# Patient Record
Sex: Female | Born: 1950 | Race: White | Hispanic: No | Marital: Married
Health system: Southern US, Community
[De-identification: ages and names within clinical notes are randomized; demographics above are authoritative.]

## PROBLEM LIST (undated history)

## (undated) HISTORY — PX: ABDOMINAL HYSTERECTOMY: SHX81

---

## 2008-10-21 ENCOUNTER — Ambulatory Visit: Payer: Self-pay | Admitting: Internal Medicine

## 2009-03-08 ENCOUNTER — Ambulatory Visit: Payer: Self-pay | Admitting: Obstetrics & Gynecology

## 2009-03-11 ENCOUNTER — Ambulatory Visit: Payer: Self-pay | Admitting: Obstetrics & Gynecology

## 2010-05-06 ENCOUNTER — Ambulatory Visit: Payer: Self-pay | Admitting: Oncology

## 2010-05-10 LAB — CBC WITH DIFFERENTIAL/PLATELET
BASO%: 0.6 % (ref 0.0–2.0)
Basophils Absolute: 0 10*3/uL (ref 0.0–0.1)
EOS%: 15.4 % — ABNORMAL HIGH (ref 0.0–7.0)
Eosinophils Absolute: 1.2 10*3/uL — ABNORMAL HIGH (ref 0.0–0.5)
HCT: 35 % (ref 34.8–46.6)
LYMPH%: 18.6 % (ref 14.0–49.7)
MCHC: 33.7 g/dL (ref 31.5–36.0)
RDW: 13.7 % (ref 11.2–14.5)
lymph#: 1.5 10*3/uL (ref 0.9–3.3)

## 2010-05-11 ENCOUNTER — Ambulatory Visit (HOSPITAL_COMMUNITY): Admission: RE | Admit: 2010-05-11 | Discharge: 2010-05-11 | Payer: Self-pay | Admitting: Oncology

## 2010-05-12 LAB — COMPREHENSIVE METABOLIC PANEL
AST: 17 U/L (ref 0–37)
BUN: 31 mg/dL — ABNORMAL HIGH (ref 6–23)
Chloride: 99 mEq/L (ref 96–112)
Creatinine, Ser: 1.26 mg/dL — ABNORMAL HIGH (ref 0.40–1.20)
Glucose, Bld: 160 mg/dL — ABNORMAL HIGH (ref 70–99)
Potassium: 3.6 mEq/L (ref 3.5–5.3)

## 2010-05-12 LAB — SPEP & IFE WITH QIG
Alpha-2-Globulin: 9.7 % (ref 7.1–11.8)
Beta Globulin: 7.8 % — ABNORMAL HIGH (ref 4.7–7.2)
Gamma Globulin: 16.5 % (ref 11.1–18.8)
IgA: 356 mg/dL (ref 68–378)
IgG (Immunoglobin G), Serum: 1270 mg/dL (ref 694–1618)

## 2010-05-12 LAB — KAPPA/LAMBDA LIGHT CHAINS
Kappa:Lambda Ratio: 1.11 (ref 0.26–1.65)
Lambda Free Lght Chn: 2.8 mg/dL — ABNORMAL HIGH (ref 0.57–2.63)

## 2010-05-12 LAB — LACTATE DEHYDROGENASE: LDH: 144 U/L (ref 94–250)

## 2010-05-20 ENCOUNTER — Ambulatory Visit (HOSPITAL_COMMUNITY): Admission: RE | Admit: 2010-05-20 | Discharge: 2010-05-20 | Payer: Self-pay | Admitting: Oncology

## 2010-06-09 ENCOUNTER — Ambulatory Visit: Payer: Self-pay | Admitting: Otolaryngology

## 2010-06-20 ENCOUNTER — Ambulatory Visit: Payer: Self-pay | Admitting: Otolaryngology

## 2010-06-23 ENCOUNTER — Encounter: Admission: RE | Admit: 2010-06-23 | Discharge: 2010-06-23 | Payer: Self-pay | Admitting: Orthopedic Surgery

## 2010-07-06 ENCOUNTER — Encounter: Admission: RE | Admit: 2010-07-06 | Discharge: 2010-07-06 | Payer: Self-pay | Admitting: Orthopedic Surgery

## 2010-08-17 ENCOUNTER — Encounter: Admission: RE | Admit: 2010-08-17 | Discharge: 2010-08-17 | Payer: Self-pay | Admitting: Orthopedic Surgery

## 2010-08-23 ENCOUNTER — Ambulatory Visit: Payer: Self-pay | Admitting: Oncology

## 2010-08-24 LAB — CBC WITH DIFFERENTIAL/PLATELET
MCH: 31.7 pg (ref 25.1–34.0)
MCHC: 34 g/dL (ref 31.5–36.0)
MONO#: 1 10*3/uL — ABNORMAL HIGH (ref 0.1–0.9)
NEUT#: 4.9 10*3/uL (ref 1.5–6.5)
Platelets: 218 10*3/uL (ref 145–400)
RBC: 3.56 10*6/uL — ABNORMAL LOW (ref 3.70–5.45)

## 2010-08-26 LAB — SPEP & IFE WITH QIG
Albumin ELP: 59.6 % (ref 55.8–66.1)
Alpha-1-Globulin: 4 % (ref 2.9–4.9)
IgA: 255 mg/dL (ref 68–378)
IgG (Immunoglobin G), Serum: 979 mg/dL (ref 694–1618)
IgM, Serum: 172 mg/dL (ref 60–263)

## 2010-08-26 LAB — COMPREHENSIVE METABOLIC PANEL
ALT: 27 U/L (ref 0–35)
AST: 18 U/L (ref 0–37)
Alkaline Phosphatase: 59 U/L (ref 39–117)
CO2: 25 mEq/L (ref 19–32)
Calcium: 9.6 mg/dL (ref 8.4–10.5)
Chloride: 103 mEq/L (ref 96–112)
Creatinine, Ser: 1 mg/dL (ref 0.40–1.20)
Glucose, Bld: 104 mg/dL — ABNORMAL HIGH (ref 70–99)
Potassium: 4.1 mEq/L (ref 3.5–5.3)
Sodium: 141 mEq/L (ref 135–145)
Total Bilirubin: 0.4 mg/dL (ref 0.3–1.2)

## 2010-08-26 LAB — KAPPA/LAMBDA LIGHT CHAINS
Kappa free light chain: 0.67 mg/dL (ref 0.33–1.94)
Kappa:Lambda Ratio: 0.76 (ref 0.26–1.65)
Lambda Free Lght Chn: 0.88 mg/dL (ref 0.57–2.63)

## 2011-02-05 LAB — GLUCOSE, CAPILLARY: Glucose-Capillary: 147 mg/dL — ABNORMAL HIGH (ref 70–99)

## 2011-03-12 ENCOUNTER — Emergency Department: Payer: Self-pay | Admitting: Emergency Medicine

## 2011-05-29 DIAGNOSIS — E119 Type 2 diabetes mellitus without complications: Secondary | ICD-10-CM | POA: Insufficient documentation

## 2011-05-29 DIAGNOSIS — J45909 Unspecified asthma, uncomplicated: Secondary | ICD-10-CM | POA: Insufficient documentation

## 2011-05-29 DIAGNOSIS — E782 Mixed hyperlipidemia: Secondary | ICD-10-CM | POA: Insufficient documentation

## 2011-08-16 DIAGNOSIS — K5909 Other constipation: Secondary | ICD-10-CM | POA: Insufficient documentation

## 2011-08-16 DIAGNOSIS — K219 Gastro-esophageal reflux disease without esophagitis: Secondary | ICD-10-CM | POA: Insufficient documentation

## 2012-03-12 ENCOUNTER — Ambulatory Visit: Payer: Self-pay | Admitting: Otolaryngology

## 2012-04-02 ENCOUNTER — Ambulatory Visit: Payer: Self-pay | Admitting: Family Medicine

## 2012-04-04 ENCOUNTER — Encounter: Payer: Self-pay | Admitting: Otolaryngology

## 2012-04-20 ENCOUNTER — Encounter: Payer: Self-pay | Admitting: Otolaryngology

## 2012-05-20 ENCOUNTER — Encounter: Payer: Self-pay | Admitting: Otolaryngology

## 2013-06-13 DIAGNOSIS — D649 Anemia, unspecified: Secondary | ICD-10-CM | POA: Insufficient documentation

## 2013-06-26 ENCOUNTER — Ambulatory Visit: Payer: Self-pay | Admitting: Family Medicine

## 2013-12-23 DIAGNOSIS — R102 Pelvic and perineal pain: Secondary | ICD-10-CM | POA: Insufficient documentation

## 2014-06-29 DIAGNOSIS — K769 Liver disease, unspecified: Secondary | ICD-10-CM | POA: Insufficient documentation

## 2014-08-13 ENCOUNTER — Ambulatory Visit: Payer: Self-pay | Admitting: Geriatric Medicine

## 2014-10-09 ENCOUNTER — Ambulatory Visit: Payer: Self-pay | Admitting: Rehabilitation

## 2015-05-03 DIAGNOSIS — N3000 Acute cystitis without hematuria: Secondary | ICD-10-CM | POA: Insufficient documentation

## 2015-10-01 DIAGNOSIS — M79673 Pain in unspecified foot: Secondary | ICD-10-CM | POA: Insufficient documentation

## 2015-11-10 DIAGNOSIS — G8929 Other chronic pain: Secondary | ICD-10-CM | POA: Insufficient documentation

## 2015-11-10 DIAGNOSIS — M545 Low back pain: Secondary | ICD-10-CM

## 2015-11-10 DIAGNOSIS — G894 Chronic pain syndrome: Secondary | ICD-10-CM | POA: Insufficient documentation

## 2016-07-05 ENCOUNTER — Encounter: Attending: Internal Medicine | Admitting: *Deleted

## 2016-07-05 VITALS — BP 122/60 | HR 61 | Ht 63.5 in | Wt 180.2 lb

## 2016-07-05 DIAGNOSIS — Z955 Presence of coronary angioplasty implant and graft: Secondary | ICD-10-CM

## 2016-07-05 DIAGNOSIS — K760 Fatty (change of) liver, not elsewhere classified: Secondary | ICD-10-CM | POA: Insufficient documentation

## 2016-07-05 NOTE — Progress Notes (Signed)
Cardiac Individual Treatment Plan  Patient Details  Name: Suzanne Bernard MRN: [id number] Date of Birth: 11-08-1951 Referring Provider:   Flowsheet Row [facility]  Referring Provider  Cavender      Initial Encounter Date:  Flowsheet Row [facility]iac [facility]05[facility]  Date  07/05/16  Referring Provider  Cavender      Visit Diagnosis: S/P coronary artery stent placement  Patient's Home Medications on Admission:  Current Outpatient Prescriptions:  .  allopurinol (ZYLOPRIM) 300 MG tablet, Take by mouth., Disp: , Rfl:  .  amLODipine (NORVASC) 10 MG tablet, Take by mouth., Disp: , Rfl:  .  carvedilol (COREG) 12.5 MG tablet, Take by mouth., Disp: , Rfl:  .  colestipol (COLESTID) 1 g tablet, TAKE 4 TABLETS DAILY, Disp: , Rfl:  .  gemfibrozil (LOPID) 600 MG tablet, Take by mouth., Disp: , Rfl:  .  glimepiride (AMARYL) 4 MG tablet, Take by mouth 2 (two) times daily., Disp: , Rfl:  .  glucose blood (FREESTYLE LITE) test strip, , Disp: , Rfl:  .  losartan-hydrochlorothiazide (HYZAAR) 100-25 MG tablet, Take by mouth., Disp: , Rfl:  .  lubiprostone (AMITIZA) 24 MCG capsule, TAKE 1 CAPSULE TWICE A DAY WITH MEALS, Disp: , Rfl:  .  metFORMIN (GLUCOPHAGE) 1000 MG tablet, TAKE 1 TABLET TWICE A DAY, Disp: , Rfl:  .  montelukast (SINGULAIR) 10 MG tablet, Take by mouth., Disp: , Rfl:  .  ranitidine (ZANTAC) 150 MG capsule, Take by mouth., Disp: , Rfl:  .  sitaGLIPtin (JANUVIA) 100 MG tablet, once daily., Disp: , Rfl:  .  gabapentin (NEURONTIN) 300 MG capsule, Take by mouth., Disp: , Rfl:  .  Insulin Glargine (LANTUS SOLOSTAR) 100 UNIT/ML Solostar Pen, Inject into the skin., Disp: , Rfl:   Past Medical History: No past medical history on file.  Tobacco Use: History  Smoking Status  . Not on file  Smokeless Tobacco  . Not on file    Labs: Recent Review Flowsheet Data    There is no flowsheet data to  display.       Exercise Target Goals: Date: 07/05/16  Exercise Program Goal: Individual exercise prescription set with THRR, safety & activity barriers. Participant demonstrates ability to understand and report RPE using BORG scale, to self-measure pulse accurately, and to acknowledge the importance of the exercise prescription.  Exercise Prescription Goal: Starting with aerobic activity 30 plus minutes a day, 3 days per week for initial exercise prescription. Provide home exercise prescription and guidelines that participant acknowledges understanding prior to discharge.  Activity Barriers & Risk Stratification:     Activity Barriers & Cardiac Risk Stratification - 07/05/16 1539      Activity Barriers & Cardiac Risk Stratification   Activity Barriers Back Problems;Deconditioning;Neck/Spine Problems;Shortness of Breath;Joint Problems      6 Minute Walk:     6 Minute Walk    Row Name 07/05/16 1516         6 Minute Walk   Distance 1333 feet     Walk Time 6 minutes     # of Rest Breaks 0     MPH 2.5     METS 2.94     RPE 8     VO2 Peak 10.3     Symptoms No     Resting HR 61 bpm     Resting BP 122/60     Max Ex. HR 90 bpm  Max Ex. BP 128/54        Initial Exercise Prescription:     Initial Exercise Prescription - 07/05/16 1500      Date of Initial Exercise RX and Referring Provider   Date 07/05/16   Referring Provider Cavender     Treadmill   MPH 2.5   Grade 0   Minutes 15   METs 2.91     Recumbant Bike   Level 2   RPM 60   Minutes 15   METs 2.98     NuStep   Level 3   Minutes 15   METs 3     Arm Ergometer   Level 1   Minutes 15   METs 2.9     REL-XR   Level 2   Minutes 15   METs 3     T5 Nustep   Level 2   Minutes 15   METs 3     Biostep-RELP   Level 3   Minutes 15   METs 3     Prescription Details   Frequency (times per week) 3   Duration Progress to 45 minutes of aerobic exercise without signs/symptoms of physical  distress     Intensity   THRR 40-80% of Max Heartrate 99-138   Ratings of Perceived Exertion 11-13   Perceived Dyspnea 0-4     Progression   Progression Continue to progress workloads to maintain intensity without signs/symptoms of physical distress.     Resistance Training   Training Prescription Yes   Weight 3   Reps 10-12      Perform Capillary Blood Glucose checks as needed.  Exercise Prescription Changes:   Exercise Comments:   Discharge Exercise Prescription (Final Exercise Prescription Changes):   Nutrition:  Target Goals: Understanding of nutrition guidelines, daily intake of sodium 1500mg , cholesterol 200mg , calories 30% from fat and 7% or less from saturated fats, daily to have 5 or more servings of fruits and vegetables.  Biometrics:     Pre Biometrics - 07/05/16 1516      Pre Biometrics   Height 5' 3.5" (1.613 m)   Weight 180 lb 3.2 oz (81.7 kg)   Waist Circumference 43.75 inches   Hip Circumference 43 inches   Waist to Hip Ratio 1.02 %   BMI (Calculated) 31.5   Single Leg Stand 19.27 seconds       Nutrition Therapy Plan and Nutrition Goals:     Nutrition Therapy & Goals - 07/05/16 1543      Nutrition Therapy   Drug/Food Interactions Statins/Certain Fruits;Purine/Gout      Nutrition Discharge: Rate Your Plate Scores:     Nutrition Assessments - 07/05/16 1545      Rate Your Plate Scores   Pre Score 66   Pre Score % 73 %      Nutrition Goals Re-Evaluation:   Psychosocial: Target Goals: Acknowledge presence or absence of depression, maximize coping skills, provide positive support system. Participant is able to verbalize types and ability to use techniques and skills needed for reducing stress and depression.  Initial Review & Psychosocial Screening:     Initial Psych Review & Screening - 07/05/16 1547      Initial Review   Current issues with Current Stress Concerns   Source of Stress Concerns Chronic Illness   Comments  [name]'s [facility]- she reports.     Family Dynamics   Good Support System? Yes  Barriers   Psychosocial barriers to participate in program The patient should benefit from training in stress management and relaxation.     Screening Interventions   Interventions Encouraged to exercise      Quality of Life Scores:     Quality of Life - 07/05/16 1645      Quality of Life Scores   Health/Function Pre 16.2 %   Socioeconomic Pre 21.79 %   Psych/Spiritual Pre 20.57 %   Family Pre 14.9 %   GLOBAL Pre 18.06 %      PHQ-9: Recent Review Flowsheet Data    Depression screen [facility] 2/9 07/05/2016   Decreased Interest 0   Down, Depressed, Hopeless 1   PHQ - 2 Score 1   Altered sleeping 1   Tired, decreased energy 3   Change in appetite 2   Feeling bad or failure about yourself  0   Trouble concentrating 1   Moving slowly or fidgety/restless 0   Suicidal thoughts 0   PHQ-9 Score 8      Psychosocial Evaluation and Intervention:   Psychosocial Re-Evaluation:   Vocational Rehabilitation: Provide vocational rehab assistance to qualifying candidates.   Vocational Rehab Evaluation & Intervention:     Vocational Rehab - 07/05/16 1547      Initial Vocational Rehab Evaluation & Intervention   Assessment shows need for Vocational Rehabilitation No      Education: Education Goals: Education classes will be provided on a weekly basis, covering required topics. Participant will state understanding/return demonstration of topics presented.  Learning Barriers/Preferences:     Learning Barriers/Preferences - 07/05/16 1540      Learning Barriers/Preferences   Learning Barriers None   Learning Preferences Individual Instruction      Education Topics: General Nutrition Guidelines/Fats and Fiber: -Group instruction provided by verbal, written material, models and posters to present the general guidelines for heart healthy  nutrition. Gives an explanation and review of dietary fats and fiber.   Controlling Sodium/Reading Food Labels: -Group verbal and written material supporting the discussion of sodium use in heart healthy nutrition. Review and explanation with models, verbal and written materials for utilization of the food label.   Exercise Physiology & Risk Factors: - Group verbal and written instruction with models to review the exercise physiology of the cardiovascular system and associated critical values. Details cardiovascular disease risk factors and the goals associated with each risk factor.   Aerobic Exercise & Resistance Training: - Gives group verbal and written discussion on the health impact of inactivity. On the components of aerobic and resistive training programs and the benefits of this training and how to safely progress through these programs.   Flexibility, Balance, General Exercise Guidelines: - Provides group verbal and written instruction on the benefits of flexibility and balance training programs. Provides general exercise guidelines with specific guidelines to those with heart or lung disease. Demonstration and skill practice provided.   Stress Management: - Provides group verbal and written instruction about the health risks of elevated stress, cause of high stress, and healthy ways to reduce stress.   Depression: - Provides group verbal and written instruction on the correlation between heart/lung disease and depressed mood, treatment options, and the stigmas associated with seeking treatment.   Anatomy & Physiology of the Heart: - Group verbal and written instruction and models provide basic cardiac anatomy and physiology, with the coronary electrical and arterial systems. Review of: AMI, Angina, Valve disease, Heart Failure, Cardiac Arrhythmia, Pacemakers, and the ICD.   Cardiac Procedures: -  Group verbal and written instruction and models to describe the testing methods  done to diagnose heart disease. Reviews the outcomes of the test results. Describes the treatment choices: Medical Management, Angioplasty, or Coronary Bypass Surgery.   Cardiac Medications: - Group verbal and written instruction to review commonly prescribed medications for heart disease. Reviews the medication, class of the drug, and side effects. Includes the steps to properly store meds and maintain the prescription regimen.   Go Sex-Intimacy & Heart Disease, Get SMART - Goal Setting: - Group verbal and written instruction through game format to discuss heart disease and the return to sexual intimacy. Provides group verbal and written material to discuss and apply goal setting through the application of the S.M.A.R.T. Method.   Other Matters of the Heart: - Provides group verbal, written materials and models to describe Heart Failure, Angina, Valve Disease, and Diabetes in the realm of heart disease. Includes description of the disease process and treatment options available to the cardiac patient.   Exercise & Equipment Safety: - Individual verbal instruction and demonstration of equipment use and safety with use of the equipment. Flowsheet Row [facility]  Date  07/05/16  Educator  C. EnterkinRN  Instruction Review Code  1- partially meets, needs review/practice      Infection Prevention: - Provides verbal and written material to individual with discussion of infection control including proper hand washing and proper equipment cleaning during exercise session. Flowsheet Row [facility]  Date  07/05/16  Educator  C. EnterkinRN  Instruction Review Code  2- meets goals/outcomes      Falls Prevention: - Provides verbal and written material to individual with discussion of falls prevention and safety. Flowsheet [facility]  Date  07/05/16  Educator  C. EnterkinRN  Instruction Review Code  2- meets goals/outcomes      Diabetes: - Individual verbal and written instruction to review signs/symptoms of diabetes, desired ranges of glucose level fasting, after meals and with exercise. Advice that pre and post exercise glucose checks will be done for 3 sessions at entry of program. Flowsheet Row Cardiac Rehab [facility]  Date  07/05/16  Educator  Salena Saner. ENterkinRN  Instruction Review Code  1- partially meets, needs review/practice       Knowledge Questionnaire Score:     Knowledge Questionnaire Score - 07/05/16 1540      Knowledge Questionnaire Score   Pre Score 20      Core Components/Risk Factors/Patient Goals at Admission:     Personal Goals and Risk Factors at Admission - 07/05/16 1545      Core Components/Risk Factors/Patient Goals on Admission    Weight Management Yes   Intervention Weight Management: Develop a combined nutrition and exercise program designed to reach desired caloric intake, while maintaining appropriate intake of nutrient and fiber, sodium and fats, and appropriate energy expenditure required for the weight goal.;Weight Management: Provide education and appropriate resources to help participant work on and attain dietary goals.;Weight Management/Obesity: Establish reasonable short term and long term weight goals.;Obesity: Provide education and appropriate resources to help participant work on and attain dietary goals.   Admit Weight 180 lb 3.2 oz (81.7 kg)   Goal Weight: Short Term 170 lb (77.1 kg)   Sedentary Yes   Intervention Provide advice, education, support and counseling about physical activity/exercise needs.;Develop  an individualized exercise prescription for aerobic and resistive training based on initial evaluation findings, risk stratification, comorbidities and participant's personal goals.   Expected Outcomes Achievement of  increased cardiorespiratory fitness and enhanced flexibility, muscular endurance and strength shown through measurements of functional capacity and personal statement of participant.   Increase Strength and Stamina Yes   Intervention Provide advice, education, support and counseling about physical activity/exercise needs.;Develop an individualized exercise prescription for aerobic and resistive training based on initial evaluation findings, risk stratification, comorbidities and participant's personal goals.   Expected Outcomes Achievement of increased cardiorespiratory fitness and enhanced flexibility, muscular endurance and strength shown through measurements of functional capacity and personal statement of participant.   Diabetes Yes   Intervention Provide education about signs/symptoms and action to take for hypo/hyperglycemia.;Provide education about proper nutrition, including hydration, and aerobic/resistive exercise prescription along with prescribed medications to achieve blood glucose in normal ranges: Fasting glucose 65-99 mg/dL   Expected Outcomes Short Term: Participant verbalizes understanding of the signs/symptoms and immediate care of hyper/hypoglycemia, proper foot care and importance of medication, aerobic/resistive exercise and nutrition plan for blood glucose control.;Long Term: Attainment of HbA1C < 7%.   Hypertension Yes   Intervention Provide education on lifestyle modifcations including regular physical activity/exercise, weight management, moderate sodium restriction and increased consumption of fresh fruit, vegetables, and low fat dairy, alcohol moderation, and smoking cessation.;Monitor prescription use compliance.   Expected Outcomes Short Term: Continued assessment and intervention until BP is < 140/34mm HG in hypertensive participants. < 130/45mm HG in hypertensive participants with diabetes, heart failure or chronic kidney disease.;Long Term: Maintenance of blood pressure at  goal levels.   Lipids Yes   Intervention Provide education and support for participant on nutrition & aerobic/resistive exercise along with prescribed medications to achieve LDL 70mg , HDL >40mg .   Expected Outcomes Short Term: Participant states understanding of desired cholesterol values and is compliant with medications prescribed. Participant is following exercise prescription and nutrition guidelines.;Long Term: Cholesterol controlled with medications as prescribed, with individualized exercise RX and with personalized nutrition plan. Value goals: LDL < 70mg , HDL > 40 mg.      Core Components/Risk Factors/Patient Goals Review:    Core Components/Risk Factors/Patient Goals at Discharge (Final Review):    ITP Comments:   Comments:

## 2016-07-05 NOTE — Progress Notes (Signed)
Cardiac Individual Treatment Plan  Patient Details  Name: Leonides Grillsancy Fay Oyster MRN: [id number] Date of Birth: 11-08-1951 Referring Provider:   Flowsheet Row [facility]  Referring Provider  Cavender      Initial Encounter Date:  Flowsheet Row Cardiac Rehab [facility]05/2016 [facility]  Date  07/05/16  Referring Provider  Cavender      Visit Diagnosis: S/P coronary artery stent placement  Patient's Home Medications on Admission:  Current Outpatient Prescriptions:  .  allopurinol (ZYLOPRIM) 300 MG tablet, Take by mouth., Disp: , Rfl:  .  amLODipine (NORVASC) 10 MG tablet, Take by mouth., Disp: , Rfl:  .  carvedilol (COREG) 12.5 MG tablet, Take by mouth., Disp: , Rfl:  .  colestipol (COLESTID) 1 g tablet, TAKE 4 TABLETS DAILY, Disp: , Rfl:  .  gemfibrozil (LOPID) 600 MG tablet, Take by mouth., Disp: , Rfl:  .  glimepiride (AMARYL) 4 MG tablet, Take by mouth 2 (two) times daily., Disp: , Rfl:  .  glucose blood (FREESTYLE LITE) test strip, , Disp: , Rfl:  .  losartan-hydrochlorothiazide (HYZAAR) 100-25 MG tablet, Take by mouth., Disp: , Rfl:  .  lubiprostone (AMITIZA) 24 MCG capsule, TAKE 1 CAPSULE TWICE A DAY WITH MEALS, Disp: , Rfl:  .  metFORMIN (GLUCOPHAGE) 1000 MG tablet, TAKE 1 TABLET TWICE A DAY, Disp: , Rfl:  .  montelukast (SINGULAIR) 10 MG tablet, Take by mouth., Disp: , Rfl:  .  ranitidine (ZANTAC) 150 MG capsule, Take by mouth., Disp: , Rfl:  .  sitaGLIPtin (JANUVIA) 100 MG tablet, once daily., Disp: , Rfl:  .  gabapentin (NEURONTIN) 300 MG capsule, Take by mouth., Disp: , Rfl:  .  Insulin Glargine (LANTUS SOLOSTAR) 100 UNIT/ML Solostar Pen, Inject into the skin., Disp: , Rfl:   Past Medical History: No past medical history on file.  Tobacco Use: History  Smoking Status  . Not on file  Smokeless Tobacco  . Not on file    Labs: Recent Review Flowsheet Data    There is no flowsheet data to  display.       Exercise Target Goals: Date: 07/05/16  Exercise Program Goal: Individual exercise prescription set with THRR, safety & activity barriers. Participant demonstrates ability to understand and report RPE using BORG scale, to self-measure pulse accurately, and to acknowledge the importance of the exercise prescription.  Exercise Prescription Goal: Starting with aerobic activity 30 plus minutes a day, 3 days per week for initial exercise prescription. Provide home exercise prescription and guidelines that participant acknowledges understanding prior to discharge.  Activity Barriers & Risk Stratification:     Activity Barriers & Cardiac Risk Stratification - 07/05/16 1539      Activity Barriers & Cardiac Risk Stratification   Activity Barriers Back Problems;Deconditioning;Neck/Spine Problems;Shortness of Breath;Joint Problems      6 Minute Walk:     6 Minute Walk    Row Name 07/05/16 1516         6 Minute Walk   Distance 1333 feet     Walk Time 6 minutes     # of Rest Breaks 0     MPH 2.5     METS 2.94     RPE 8     VO2 Peak 10.3     Symptoms No     Resting HR 61 bpm     Resting BP 122/60     Max Ex. HR 90 bpm  Max Ex. BP 128/54        Initial Exercise Prescription:     Initial Exercise Prescription - 07/05/16 1500      Date of Initial Exercise RX and Referring Provider   Date 07/05/16   Referring Provider Cavender     Treadmill   MPH 2.5   Grade 0   Minutes 15   METs 2.91     Recumbant Bike   Level 2   RPM 60   Minutes 15   METs 2.98     NuStep   Level 3   Minutes 15   METs 3     Arm Ergometer   Level 1   Minutes 15   METs 2.9     REL-XR   Level 2   Minutes 15   METs 3     T5 Nustep   Level 2   Minutes 15   METs 3     Biostep-RELP   Level 3   Minutes 15   METs 3     Prescription Details   Frequency (times per week) 3   Duration Progress to 45 minutes of aerobic exercise without signs/symptoms of physical  distress     Intensity   THRR 40-80% of Max Heartrate 99-138   Ratings of Perceived Exertion 11-13   Perceived Dyspnea 0-4     Progression   Progression Continue to progress workloads to maintain intensity without signs/symptoms of physical distress.     Resistance Training   Training Prescription Yes   Weight 3   Reps 10-12      Perform Capillary Blood Glucose checks as needed.  Exercise Prescription Changes:   Exercise Comments:   Discharge Exercise Prescription (Final Exercise Prescription Changes):   Nutrition:  Target Goals: Understanding of nutrition guidelines, daily intake of sodium 1500mg , cholesterol 200mg , calories 30% from fat and 7% or less from saturated fats, daily to have 5 or more servings of fruits and vegetables.  Biometrics:     Pre Biometrics - 07/05/16 1516      Pre Biometrics   Height 5' 3.5" (1.613 m)   Weight 180 lb 3.2 oz (81.7 kg)   Waist Circumference 43.75 inches   Hip Circumference 43 inches   Waist to Hip Ratio 1.02 %   BMI (Calculated) 31.5   Single Leg Stand 19.27 seconds       Nutrition Therapy Plan and Nutrition Goals:     Nutrition Therapy & Goals - 07/05/16 1543      Nutrition Therapy   Drug/Food Interactions Statins/Certain Fruits;Purine/Gout      Nutrition Discharge: Rate Your Plate Scores:     Nutrition Assessments - 07/05/16 1545      Rate Your Plate Scores   Pre Score 66   Pre Score % 73 %      Nutrition Goals Re-Evaluation:   Psychosocial: Target Goals: Acknowledge presence or absence of depression, maximize coping skills, provide positive support system. Participant is able to verbalize types and ability to use techniques and skills needed for reducing stress and depression.  Initial Review & Psychosocial Screening:     Initial Psych Review & Screening - 07/05/16 1547      Initial Review   Current issues with Current Stress Concerns   Source of Stress Concerns Chronic Illness   Comments  [name]'s [facility] for At[facility] Fl[facility]- she reports.     Family Dynamics   Good Support System? Yes  Barriers   Psychosocial barriers to participate in program The patient should benefit from training in stress management and relaxation.     Screening Interventions   Interventions Encouraged to exercise      Quality of Life Scores:   PHQ-9: Recent Review Flowsheet Data    Depression screen [facility] 2/9 07/05/2016   Decreased Interest 0   Down, Depressed, Hopeless 1   PHQ - 2 Score 1   Altered sleeping 1   Tired, decreased energy 3   Change in appetite 2   Feeling bad or failure about yourself  0   Trouble concentrating 1   Moving slowly or fidgety/restless 0   Suicidal thoughts 0   PHQ-9 Score 8      Psychosocial Evaluation and Intervention:   Psychosocial Re-Evaluation:   Vocational Rehabilitation: Provide vocational rehab assistance to qualifying candidates.   Vocational Rehab Evaluation & Intervention:     Vocational Rehab - 07/05/16 1547      Initial Vocational Rehab Evaluation & Intervention   Assessment shows need for Vocational Rehabilitation No      Education: Education Goals: Education classes will be provided on a weekly basis, covering required topics. Participant will state understanding/return demonstration of topics presented.  Learning Barriers/Preferences:     Learning Barriers/Preferences - 07/05/16 1540      Learning Barriers/Preferences   Learning Barriers None   Learning Preferences Individual Instruction      Education Topics: General Nutrition Guidelines/Fats and Fiber: -Group instruction provided by verbal, written material, models and posters to present the general guidelines for heart healthy nutrition. Gives an explanation and review of dietary fats and fiber.   Controlling Sodium/Reading Food Labels: -Group verbal and written material supporting the discussion of sodium use in heart  healthy nutrition. Review and explanation with models, verbal and written materials for utilization of the food label.   Exercise Physiology & Risk Factors: - Group verbal and written instruction with models to review the exercise physiology of the cardiovascular system and associated critical values. Details cardiovascular disease risk factors and the goals associated with each risk factor.   Aerobic Exercise & Resistance Training: - Gives group verbal and written discussion on the health impact of inactivity. On the components of aerobic and resistive training programs and the benefits of this training and how to safely progress through these programs.   Flexibility, Balance, General Exercise Guidelines: - Provides group verbal and written instruction on the benefits of flexibility and balance training programs. Provides general exercise guidelines with specific guidelines to those with heart or lung disease. Demonstration and skill practice provided.   Stress Management: - Provides group verbal and written instruction about the health risks of elevated stress, cause of high stress, and healthy ways to reduce stress.   Depression: - Provides group verbal and written instruction on the correlation between heart/lung disease and depressed mood, treatment options, and the stigmas associated with seeking treatment.   Anatomy & Physiology of the Heart: - Group verbal and written instruction and models provide basic cardiac anatomy and physiology, with the coronary electrical and arterial systems. Review of: AMI, Angina, Valve disease, Heart Failure, Cardiac Arrhythmia, Pacemakers, and the ICD.   Cardiac Procedures: - Group verbal and written instruction and models to describe the testing methods done to diagnose heart disease. Reviews the outcomes of the test results. Describes the treatment choices: Medical Management, Angioplasty, or Coronary Bypass Surgery.   Cardiac Medications: -  Group verbal and written instruction to review commonly prescribed medications  for heart disease. Reviews the medication, class of the drug, and side effects. Includes the steps to properly store meds and maintain the prescription regimen.   Go Sex-Intimacy & Heart Disease, Get SMART - Goal Setting: - Group verbal and written instruction through game format to discuss heart disease and the return to sexual intimacy. Provides group verbal and written material to discuss and apply goal setting through the application of the S.M.A.R.T. Method.   Other Matters of the Heart: - Provides group verbal, written materials and models to describe Heart Failure, Angina, Valve Disease, and Diabetes in the realm of heart disease. Includes description of the disease process and treatment options available to the cardiac patient.   Exercise & Equipment Safety: - Individual verbal instruction and demonstration of equipment use and safety with use of the equipment. Flowsheet Row Cardiac Rehab from 07/05/2016 in [facility] Cardiac and Pulmonary Rehab  Date  07/05/16  Educator  C. EnterkinRN  Instruction Review Code  1- partially meets, needs review/practice      Infection Prevention: - Provides verbal and written material to individual with discussion of infection control including proper hand washing and proper equipment cleaning during exercise session. Flowsheet Row Cardiac Rehab from 07/05/2016 in [facility] Cardiac and Pulmonary Rehab  Date  07/05/16  Educator  C. EnterkinRN  Instruction Review Code  2- meets goals/outcomes      Falls Prevention: - Provides verbal and written material to individual with discussion of falls prevention and safety. Flowsheet Row Cardiac Rehab from 07/05/2016 in [facility] Cardiac and Pulmonary Rehab  Date  07/05/16  Educator  C. EnterkinRN  Instruction Review Code  2- meets goals/outcomes      Diabetes: - Individual verbal and written instruction to review signs/symptoms of  diabetes, desired ranges of glucose level fasting, after meals and with exercise. Advice that pre and post exercise glucose checks will be done for 3 sessions at entry of program. Flowsheet Row Cardiac Rehab from 07/05/2016 in [facility] Cardiac and Pulmonary Rehab  Date  07/05/16  Educator  Salena Saner ENterkinRN  Instruction Review Code  1- partially meets, needs review/practice       Knowledge Questionnaire Score:     Knowledge Questionnaire Score - 07/05/16 1540      Knowledge Questionnaire Score   Pre Score 20      Core Components/Risk Factors/Patient Goals at Admission:     Personal Goals and Risk Factors at Admission - 07/05/16 1545      Core Components/Risk Factors/Patient Goals on Admission    Weight Management Yes   Intervention Weight Management: Develop a combined nutrition and exercise program designed to reach desired caloric intake, while maintaining appropriate intake of nutrient and fiber, sodium and fats, and appropriate energy expenditure required for the weight goal.;Weight Management: Provide education and appropriate resources to help participant work on and attain dietary goals.;Weight Management/Obesity: Establish reasonable short term and long term weight goals.;Obesity: Provide education and appropriate resources to help participant work on and attain dietary goals.   Admit Weight 180 lb 3.2 oz (81.7 kg)   Goal Weight: Short Term 170 lb (77.1 kg)   Sedentary Yes   Intervention Provide advice, education, support and counseling about physical activity/exercise needs.;Develop an individualized exercise prescription for aerobic and resistive training based on initial evaluation findings, risk stratification, comorbidities and participant's personal goals.   Expected Outcomes Achievement of increased cardiorespiratory fitness and enhanced flexibility, muscular endurance and strength shown through measurements of functional capacity and personal statement of participant.  Increase Strength and Stamina Yes   Intervention Provide advice, education, support and counseling about physical activity/exercise needs.;Develop an individualized exercise prescription for aerobic and resistive training based on initial evaluation findings, risk stratification, comorbidities and participant's personal goals.   Expected Outcomes Achievement of increased cardiorespiratory fitness and enhanced flexibility, muscular endurance and strength shown through measurements of functional capacity and personal statement of participant.   Diabetes Yes   Intervention Provide education about signs/symptoms and action to take for hypo/hyperglycemia.;Provide education about proper nutrition, including hydration, and aerobic/resistive exercise prescription along with prescribed medications to achieve blood glucose in normal ranges: Fasting glucose 65-99 mg/dL   Expected Outcomes Short Term: Participant verbalizes understanding of the signs/symptoms and immediate care of hyper/hypoglycemia, proper foot care and importance of medication, aerobic/resistive exercise and nutrition plan for blood glucose control.;Long Term: Attainment of HbA1C < 7%.   Hypertension Yes   Intervention Provide education on lifestyle modifcations including regular physical activity/exercise, weight management, moderate sodium restriction and increased consumption of fresh fruit, vegetables, and low fat dairy, alcohol moderation, and smoking cessation.;Monitor prescription use compliance.   Expected Outcomes Short Term: Continued assessment and intervention until BP is < 140/2790mm HG in hypertensive participants. < 130/7580mm HG in hypertensive participants with diabetes, heart failure or chronic kidney disease.;Long Term: Maintenance of blood pressure at goal levels.   Lipids Yes   Intervention Provide education and support for participant on nutrition & aerobic/resistive exercise along with prescribed medications to achieve LDL 70mg ,  HDL >40mg .   Expected Outcomes Short Term: Participant states understanding of desired cholesterol values and is compliant with medications prescribed. Participant is following exercise prescription and nutrition guidelines.;Long Term: Cholesterol controlled with medications as prescribed, with individualized exercise RX and with personalized nutrition plan. Value goals: LDL < 70mg , HDL > 40 mg.      Core Components/Risk Factors/Patient Goals Review:    Core Components/Risk Factors/Patient Goals at Discharge (Final Review):    ITP Comments:   Comments:

## 2016-07-05 NOTE — Progress Notes (Signed)
Daily Session Note  Patient Details  Name: Suzanne Bernard MRN: [id number] Date of Birth: 12-24-1950 Referring Provider:   Flowsheet Row Cardiac Rehab from 07/05/2016 in [facility] Cardiac and Pulmonary Rehab  Referring Provider  Cavender      Encounter Date: 07/05/2016  Check In:     Session Check In - 07/05/16 1540      Check-In   Location [facility]-Cardiac & Pulmonary Rehab   Staff Present Gerlene Burdock, RN, Vickki Hearing, BA, ACSM CEP, Exercise Physiologist   Supervising physician immediately available to respond to emergencies See telemetry face sheet for immediately available ER MD   Medication changes reported     No   Fall or balance concerns reported    No   Warm-up and Cool-down Performed on first and last piece of equipment   Resistance Training Performed No   VAD Patient? No     Pain Assessment   Currently in Pain? --  Chronic foot pain/peripheral neuropathy         Goals Met:  Personal goals reviewed  Goals Unmet:  Not Applicable  Comments:     Dr. Emily Filbert is Medical Director for [employer] and [facility].

## 2016-07-05 NOTE — Patient Instructions (Signed)
Patient Instructions  Patient Details  Name: Suzanne Bernard MRN: [id number] Date of Birth: 12-23-1950 Referring Provider:  Rosana Hoesavender, Matthew Aaron*  Below are the personal goals you chose as well as exercise and nutrition goals. Our goal is to help you keep on track towards obtaining and maintaining your goals. We will be discussing your progress on these goals with you throughout the program.  Initial Exercise Prescription:     Initial Exercise Prescription - 07/05/16 1500      Date of Initial Exercise RX and Referring Provider   Date 07/05/16   Referring Provider Cavender     Treadmill   MPH 2.5   Grade 0   Minutes 15   METs 2.91     Recumbant Bike   Level 2   RPM 60   Minutes 15   METs 2.98     NuStep   Level 3   Minutes 15   METs 3     Arm Ergometer   Level 1   Minutes 15   METs 2.9     REL-XR   Level 2   Minutes 15   METs 3     T5 Nustep   Level 2   Minutes 15   METs 3     Biostep-RELP   Level 3   Minutes 15   METs 3     Prescription Details   Frequency (times per week) 3   Duration Progress to 45 minutes of aerobic exercise without signs/symptoms of physical distress     Intensity   THRR 40-80% of Max Heartrate 99-138   Ratings of Perceived Exertion 11-13   Perceived Dyspnea 0-4     Progression   Progression Continue to progress workloads to maintain intensity without signs/symptoms of physical distress.     Resistance Training   Training Prescription Yes   Weight 3   Reps 10-12      Exercise Goals: Frequency: Be able to perform aerobic exercise three times per week working toward 3-5 days per week.  Intensity: Work with a perceived exertion of 11 (fairly light) - 15 (hard) as tolerated. Follow your new exercise prescription and watch for changes in prescription as you progress with the program. Changes will be reviewed with you when they are made.  Duration: You should be able to do 30 minutes of continuous aerobic exercise in  addition to a 5 minute warm-up and a 5 minute cool-down routine.  Nutrition Goals: Your personal nutrition goals will be established when you do your nutrition analysis with the dietician.  The following are nutrition guidelines to follow: Cholesterol < 200mg /day Sodium < 1500mg /day Fiber: Women over 50 yrs - 21 grams per day  Personal Goals:     Personal Goals and Risk Factors at Admission - 07/05/16 1545      Core Components/Risk Factors/Patient Goals on Admission    Weight Management Yes   Intervention Weight Management: Develop a combined nutrition and exercise program designed to reach desired caloric intake, while maintaining appropriate intake of nutrient and fiber, sodium and fats, and appropriate energy expenditure required for the weight goal.;Weight Management: Provide education and appropriate resources to help participant work on and attain dietary goals.;Weight Management/Obesity: Establish reasonable short term and long term weight goals.;Obesity: Provide education and appropriate resources to help participant work on and attain dietary goals.   Admit Weight 180 lb 3.2 oz (81.7 kg)   Goal Weight: Short Term 170 lb (77.1 kg)   Sedentary Yes   Intervention  Provide advice, education, support and counseling about physical activity/exercise needs.;Develop an individualized exercise prescription for aerobic and resistive training based on initial evaluation findings, risk stratification, comorbidities and participant's personal goals.   Expected Outcomes Achievement of increased cardiorespiratory fitness and enhanced flexibility, muscular endurance and strength shown through measurements of functional capacity and personal statement of participant.   Increase Strength and Stamina Yes   Intervention Provide advice, education, support and counseling about physical activity/exercise needs.;Develop an individualized exercise prescription for aerobic and resistive training based on  initial evaluation findings, risk stratification, comorbidities and participant's personal goals.   Expected Outcomes Achievement of increased cardiorespiratory fitness and enhanced flexibility, muscular endurance and strength shown through measurements of functional capacity and personal statement of participant.   Diabetes Yes   Intervention Provide education about signs/symptoms and action to take for hypo/hyperglycemia.;Provide education about proper nutrition, including hydration, and aerobic/resistive exercise prescription along with prescribed medications to achieve blood glucose in normal ranges: Fasting glucose 65-99 mg/dL   Expected Outcomes Short Term: Participant verbalizes understanding of the signs/symptoms and immediate care of hyper/hypoglycemia, proper foot care and importance of medication, aerobic/resistive exercise and nutrition plan for blood glucose control.;Long Term: Attainment of HbA1C < 7%.   Hypertension Yes   Intervention Provide education on lifestyle modifcations including regular physical activity/exercise, weight management, moderate sodium restriction and increased consumption of fresh fruit, vegetables, and low fat dairy, alcohol moderation, and smoking cessation.;Monitor prescription use compliance.   Expected Outcomes Short Term: Continued assessment and intervention until BP is < 140/2790mm HG in hypertensive participants. < 130/4980mm HG in hypertensive participants with diabetes, heart failure or chronic kidney disease.;Long Term: Maintenance of blood pressure at goal levels.   Lipids Yes   Intervention Provide education and support for participant on nutrition & aerobic/resistive exercise along with prescribed medications to achieve LDL 70mg , HDL >40mg .   Expected Outcomes Short Term: Participant states understanding of desired cholesterol values and is compliant with medications prescribed. Participant is following exercise prescription and nutrition guidelines.;Long  Term: Cholesterol controlled with medications as prescribed, with individualized exercise RX and with personalized nutrition plan. Value goals: LDL < 70mg , HDL > 40 mg.      Tobacco Use Initial Evaluation: History  Smoking Status  . Not on file  Smokeless Tobacco  . Not on file    Copy of goals given to participant.

## 2016-07-17 ENCOUNTER — Encounter

## 2016-07-17 DIAGNOSIS — Z955 Presence of coronary angioplasty implant and graft: Secondary | ICD-10-CM | POA: Diagnosis not present

## 2016-07-17 NOTE — Progress Notes (Signed)
Daily Session Note  Patient Details  Name: Suzanne Bernard MRN: [id number] Date of Birth: 11/15/1951 Referring Provider:   Flowsheet Row Cardiac Rehab from 07/05/2016 in [facility]  Referring Provider  Cavender      Encounter Date: 07/17/2016  Check In:     Session Check In - 07/17/16 1645      Check-In   Location [facility]   Staff Present Mary Jo Abernethy, RN, BSN, MA;Kelly Hayes, BS, ACSM CEP, Exercise Physiologist;Amanda Sommer, BA, ACSM CEP, Exercise Physiologist   Supervising physician immediately available to respond to emergencies See telemetry face sheet for immediately available ER MD   Medication changes reported     No   Fall or balance concerns reported    No   Warm-up and Cool-down Performed on first and last piece of equipment   Resistance Training Performed Yes   VAD Patient? No     Pain Assessment   Currently in Pain? No/denies   Multiple Pain Sites No         Goals Met:  Independence with exercise equipment Exercise tolerated well No report of cardiac concerns or symptoms Strength training completed today  Goals Unmet:  Not Applicable  Comments: First full day of exercise!  Patient was oriented to gym and equipment including functions, settings, policies, and procedures.  Patient's individual exercise prescription and treatment plan were reviewed.  All starting workloads were established based on the results of the 6 minute walk test done at initial orientation visit.  The plan for exercise progression was also introduced and progression will be customized based on patient's performance and goals.   Dr. Mark Miller is Medical Director for [facility] and [facility]. 

## 2016-07-18 ENCOUNTER — Encounter: Payer: Self-pay | Admitting: Dietician

## 2016-07-19 ENCOUNTER — Telehealth: Payer: Self-pay | Admitting: *Deleted

## 2016-07-19 ENCOUNTER — Encounter

## 2016-07-19 ENCOUNTER — Encounter: Payer: Self-pay | Admitting: *Deleted

## 2016-07-19 NOTE — Telephone Encounter (Signed)
Suzanne Bernard said she is sorry that she can't attend Cardiac Rehab lately since she has doctor's appts.

## 2016-07-20 ENCOUNTER — Encounter

## 2016-07-20 NOTE — Telephone Encounter (Signed)
Has attended her Cardiac Rehab Orientation appt.

## 2016-07-26 ENCOUNTER — Encounter: Attending: Internal Medicine | Admitting: *Deleted

## 2016-07-26 DIAGNOSIS — Z955 Presence of coronary angioplasty implant and graft: Secondary | ICD-10-CM | POA: Diagnosis not present

## 2016-07-26 LAB — GLUCOSE, CAPILLARY
GLUCOSE-CAPILLARY: 109 mg/dL — AB (ref 65–99)
GLUCOSE-CAPILLARY: 77 mg/dL (ref 65–99)
GLUCOSE-CAPILLARY: 100 mg/dL — AB (ref 65–99)
Glucose-Capillary: 62 mg/dL — ABNORMAL LOW (ref 65–99)

## 2016-07-26 NOTE — Progress Notes (Signed)
Daily Session Note  Patient Details  Name: Suzanne Bernard MRN: [id number] Date of Birth: 1951/04/28 Referring Provider:   Flowsheet Row Cardiac Rehab from 07/05/2016 in [facility]  Referring Provider  Cavender      Encounter Date: 07/26/2016  Check In:     Session Check In - 07/26/16 1822      Check-In   Staff Present Heath Lark, RN, BSN, CCRP;Carroll Enterkin, RN, Vickki Hearing, BA, ACSM CEP, Exercise Physiologist   Supervising physician immediately available to respond to emergencies See telemetry face sheet for immediately available ER MD   Medication changes reported     No   Fall or balance concerns reported    No   Warm-up and Cool-down Performed on first and last piece of equipment   Resistance Training Performed Yes   VAD Patient? No     Pain Assessment   Currently in Pain? No/denies         Goals Met:  Exercise tolerated well No report of cardiac concerns or symptoms Strength training completed today  Goals Unmet:  Not Applicable  Comments: Doing well with exercise prescription progression.  See ITP notes    Dr. Emily Filbert is Medical Director for [place] and [facility].

## 2016-07-27 ENCOUNTER — Encounter: Admitting: *Deleted

## 2016-07-27 DIAGNOSIS — Z955 Presence of coronary angioplasty implant and graft: Secondary | ICD-10-CM | POA: Diagnosis not present

## 2016-07-27 LAB — GLUCOSE, CAPILLARY
Glucose-Capillary: 136 mg/dL — ABNORMAL HIGH (ref 65–99)
Glucose-Capillary: 129 mg/dL — ABNORMAL HIGH (ref 65–99)

## 2016-07-27 NOTE — Progress Notes (Signed)
Daily Session Note  Patient Details  Name: Suzanne Bernard MRN: [id number] Date of Birth: 1951-11-15 Referring Provider:   Flowsheet Row Cardiac Rehab from 07/05/2016 in [facility] Cardiac and [facility] Re[facility]  Referring Provider  Cavender      Encounter Date: 07/27/2016  Check In:     Session Check In - 07/27/16 1720      Check-In   Location [facility]   Staff Present Alberteen Sam, MA, ACSM RCEP, Exercise Physiologist;Amanda Oletta Darter, BA, ACSM CEP, Exercise Physiologist;Carroll Enterkin, RN, BSN   Supervising physician immediately available to respond to emergencies See telemetry face sheet for immediately available ER MD   Medication changes reported     No   Fall or balance concerns reported    No   Warm-up and Cool-down Performed on first and last piece of equipment   Resistance Training Performed Yes   VAD Patient? No     Pain Assessment   Currently in Pain? No/denies   Multiple Pain Sites No           Exercise Prescription Changes - 07/27/16 1200      Exercise Review   Progression Yes     Response to Exercise   Blood Pressure (Admit) 118/60   Blood Pressure (Exercise) 124/62   Blood Pressure (Exit) 126/64   Heart Rate (Admit) 53 bpm   Heart Rate (Exercise) 111 bpm   Heart Rate (Exit) 83 bpm   Rating of Perceived Exertion (Exercise) 15   Symptoms none   Duration Progress to 45 minutes of aerobic exercise without signs/symptoms of physical distress   Intensity THRR unchanged     Progression   Progression Continue to progress workloads to maintain intensity without signs/symptoms of physical distress.   Average METs 2.2     Resistance Training   Training Prescription Yes   Weight 2   Reps 10-15     Interval Training   Interval Training No     NuStep   Level 1   Minutes 15   METs 2.4     Arm Ergometer   Level 1   Minutes 15   METs 2      Goals Met:  Independence with exercise equipment Exercise tolerated well No report of  cardiac concerns or symptoms Strength training completed today  Goals Unmet:  Not Applicable  Comments: Pt able to follow exercise prescription today without complaint.  Will continue to monitor for progression.    Dr. Emily Filbert is Medical Director for [place] and [facility].

## 2016-07-31 ENCOUNTER — Encounter

## 2016-08-02 ENCOUNTER — Encounter: Admitting: *Deleted

## 2016-08-02 ENCOUNTER — Encounter: Payer: Self-pay | Admitting: *Deleted

## 2016-08-02 DIAGNOSIS — Z955 Presence of coronary angioplasty implant and graft: Secondary | ICD-10-CM

## 2016-08-02 LAB — GLUCOSE, CAPILLARY: Glucose-Capillary: 119 mg/dL — ABNORMAL HIGH (ref 65–99)

## 2016-08-02 NOTE — Progress Notes (Signed)
Cardiac Individual Treatment Plan  Patient Details  Name: Suzanne Bernard MRN: [id number] Date of Birth: 1951/10/21 Referring Provider:   Flowsheet Row Cardiac Rehab from 07/05/2016 in [facility] Cardiac and Pulmonary Rehab  Referring Provider  Cavender      Initial Encounter Date:  Flowsheet Row Cardiac Rehab from 07/05/2016 in [facility] Cardiac and Pulmonary Rehab  Date  07/05/16  Referring Provider  Cavender      Visit Diagnosis: S/P coronary artery stent placement  Patient's Home Medications on Admission:  Current Outpatient Prescriptions:  .  allopurinol (ZYLOPRIM) 300 MG tablet, Take by mouth., Disp: , Rfl:  .  amLODipine (NORVASC) 10 MG tablet, Take by mouth., Disp: , Rfl:  .  carvedilol (COREG) 12.5 MG tablet, Take by mouth., Disp: , Rfl:  .  colestipol (COLESTID) 1 g tablet, TAKE 4 TABLETS DAILY, Disp: , Rfl:  .  gabapentin (NEURONTIN) 300 MG capsule, Take by mouth., Disp: , Rfl:  .  gemfibrozil (LOPID) 600 MG tablet, Take by mouth., Disp: , Rfl:  .  glimepiride (AMARYL) 4 MG tablet, Take by mouth 2 (two) times daily., Disp: , Rfl:  .  glucose blood (FREESTYLE LITE) test strip, , Disp: , Rfl:  .  Insulin Glargine (LANTUS SOLOSTAR) 100 UNIT/ML Solostar Pen, Inject into the skin., Disp: , Rfl:  .  losartan-hydrochlorothiazide (HYZAAR) 100-25 MG tablet, Take by mouth., Disp: , Rfl:  .  lubiprostone (AMITIZA) 24 MCG capsule, TAKE 1 CAPSULE TWICE A DAY WITH MEALS, Disp: , Rfl:  .  metFORMIN (GLUCOPHAGE) 1000 MG tablet, TAKE 1 TABLET TWICE A DAY, Disp: , Rfl:  .  montelukast (SINGULAIR) 10 MG tablet, Take by mouth., Disp: , Rfl:  .  ranitidine (ZANTAC) 150 MG capsule, Take by mouth., Disp: , Rfl:  .  sitaGLIPtin (JANUVIA) 100 MG tablet, once daily., Disp: , Rfl:   Past Medical History: No past medical history on file.  Tobacco Use: History  Smoking Status  . Not on file  Smokeless Tobacco  . Not on file    Labs: Recent Review Flowsheet Data    There is no flowsheet data to  display.       Exercise Target Goals:    Exercise Program Goal: Individual exercise prescription set with THRR, safety & activity barriers. Participant demonstrates ability to understand and report RPE using BORG scale, to self-measure pulse accurately, and to acknowledge the importance of the exercise prescription.  Exercise Prescription Goal: Starting with aerobic activity 30 plus minutes a day, 3 days per week for initial exercise prescription. Provide home exercise prescription and guidelines that participant acknowledges understanding prior to discharge.  Activity Barriers & Risk Stratification:     Activity Barriers & Cardiac Risk Stratification - 07/05/16 1539      Activity Barriers & Cardiac Risk Stratification   Activity Barriers Back Problems;Deconditioning;Neck/Spine Problems;Shortness of Breath;Joint Problems      6 Minute Walk:     6 Minute Walk    Row Name 07/05/16 1516         6 Minute Walk   Distance 1333 feet     Walk Time 6 minutes     # of Rest Breaks 0     MPH 2.5     METS 2.94     RPE 8     VO2 Peak 10.3     Symptoms No     Resting HR 61 bpm     Resting BP 122/60     Max Ex. HR 90 bpm  Max Ex. BP 128/54        Initial Exercise Prescription:     Initial Exercise Prescription - 07/05/16 1500      Date of Initial Exercise RX and Referring Provider   Date 07/05/16   Referring Provider Cavender     Treadmill   MPH 2.5   Grade 0   Minutes 15   METs 2.91     Recumbant Bike   Level 2   RPM 60   Minutes 15   METs 2.98     NuStep   Level 3   Minutes 15   METs 3     Arm Ergometer   Level 1   Minutes 15   METs 2.9     REL-XR   Level 2   Minutes 15   METs 3     T5 Nustep   Level 2   Minutes 15   METs 3     Biostep-RELP   Level 3   Minutes 15   METs 3     Prescription Details   Frequency (times per week) 3   Duration Progress to 45 minutes of aerobic exercise without signs/symptoms of physical distress      Intensity   THRR 40-80% of Max Heartrate 99-138   Ratings of Perceived Exertion 11-13   Perceived Dyspnea 0-4     Progression   Progression Continue to progress workloads to maintain intensity without signs/symptoms of physical distress.     Resistance Training   Training Prescription Yes   Weight 3   Reps 10-12      Perform Capillary Blood Glucose checks as needed.  Exercise Prescription Changes:     Exercise Prescription Changes    Row Name 07/27/16 1200             Exercise Review   Progression Yes         Response to Exercise   Blood Pressure (Admit) 118/60       Blood Pressure (Exercise) 124/62       Blood Pressure (Exit) 126/64       Heart Rate (Admit) 53 bpm       Heart Rate (Exercise) 111 bpm       Heart Rate (Exit) 83 bpm       Rating of Perceived Exertion (Exercise) 15       Symptoms none       Duration Progress to 45 minutes of aerobic exercise without signs/symptoms of physical distress       Intensity THRR unchanged         Progression   Progression Continue to progress workloads to maintain intensity without signs/symptoms of physical distress.       Average METs 2.2         Resistance Training   Training Prescription Yes       Weight 2       Reps 10-15         Interval Training   Interval Training No         NuStep   Level 1       Minutes 15       METs 2.4         Arm Ergometer   Level 1       Minutes 15       METs 2          Exercise Comments:     Exercise Comments    Row Name 07/27/16 1208  Exercise Comments Suzanne Bernard is progressing well with exercise.          Discharge Exercise Prescription (Final Exercise Prescription Changes):     Exercise Prescription Changes - 07/27/16 1200      Exercise Review   Progression Yes     Response to Exercise   Blood Pressure (Admit) 118/60   Blood Pressure (Exercise) 124/62   Blood Pressure (Exit) 126/64   Heart Rate (Admit) 53 bpm   Heart Rate (Exercise) 111 bpm    Heart Rate (Exit) 83 bpm   Rating of Perceived Exertion (Exercise) 15   Symptoms none   Duration Progress to 45 minutes of aerobic exercise without signs/symptoms of physical distress   Intensity THRR unchanged     Progression   Progression Continue to progress workloads to maintain intensity without signs/symptoms of physical distress.   Average METs 2.2     Resistance Training   Training Prescription Yes   Weight 2   Reps 10-15     Interval Training   Interval Training No     NuStep   Level 1   Minutes 15   METs 2.4     Arm Ergometer   Level 1   Minutes 15   METs 2      Nutrition:  Target Goals: Understanding of nutrition guidelines, daily intake of sodium 1500mg , cholesterol 200mg , calories 30% from fat and 7% or less from saturated fats, daily to have 5 or more servings of fruits and vegetables.  Biometrics:     Pre Biometrics - 07/05/16 1516      Pre Biometrics   Height 5' 3.5" (1.613 m)   Weight 180 lb 3.2 oz (81.7 kg)   Waist Circumference 43.75 inches   Hip Circumference 43 inches   Waist to Hip Ratio 1.02 %   BMI (Calculated) 31.5   Single Leg Stand 19.27 seconds       Nutrition Therapy Plan and Nutrition Goals:     Nutrition Therapy & Goals - 07/18/16 1132      Nutrition Therapy   Diet 1350 kcal Diabetes and heart healthy diet   Protein (specify units) 6   Fiber 20 grams   Whole Grain Foods 3 servings   Saturated Fats 11 max. grams   Fruits and Vegetables 8 servings/day   Sodium 1500 grams     Personal Nutrition Goals   Personal Goal #1 Control food portions by using small plates and eating generous portions of low-cal vegetables.    Comments patient reports feeling hungry almost constantly, and also states she has gastroparesis, but no issues with nausea or vomiting at this time. Advised small amounts of food often during the day, and limiting fiber intake.      Intervention Plan   Intervention Prescribe, educate and counsel regarding  individualized specific dietary modifications aiming towards targeted core components such as weight, hypertension, lipid management, diabetes, heart failure and other comorbidities.;Nutrition handout(s) given to patient.   Expected Outcomes Short Term Goal: Understand basic principles of dietary content, such as calories, fat, sodium, cholesterol and nutrients.;Short Term Goal: A plan has been developed with personal nutrition goals set during dietitian appointment.;Long Term Goal: Adherence to prescribed nutrition plan.      Nutrition Discharge: Rate Your Plate Scores:     Nutrition Assessments - 07/05/16 1545      Rate Your Plate Scores   Pre Score 66   Pre Score % 73 %      Nutrition Goals Re-Evaluation:   Psychosocial:  Target Goals: Acknowledge presence or absence of depression, maximize coping skills, provide positive support system. Participant is able to verbalize types and ability to use techniques and skills needed for reducing stress and depression.  Initial Review & Psychosocial Screening:     Initial Psych Review & Screening - 07/05/16 1547      Initial Review   Current issues with Current Stress Concerns   Source of Stress Concerns Chronic Illness   Comments Suzanne Bernard's husband is currently in the hospital at [facility] for Atrial Flutter- she reports.     Family Dynamics   Good Support System? Yes     Barriers   Psychosocial barriers to participate in program The patient should benefit from training in stress management and relaxation.     Screening Interventions   Interventions Encouraged to exercise      Quality of Life Scores:     Quality of Life - 07/05/16 1645      Quality of Life Scores   Health/Function Pre 16.2 %   Socioeconomic Pre 21.79 %   Psych/Spiritual Pre 20.57 %   Family Pre 14.9 %   GLOBAL Pre 18.06 %      PHQ-9: Recent Review Flowsheet Data    Depression screen [facility] 2/9 07/05/2016   Decreased Interest 0   Down, Depressed, Hopeless 1    PHQ - 2 Score 1   Altered sleeping 1   Tired, decreased energy 3   Change in appetite 2   Feeling bad or failure about yourself  0   Trouble concentrating 1   Moving slowly or fidgety/restless 0   Suicidal thoughts 0   PHQ-9 Score 8      Psychosocial Evaluation and Intervention:   Psychosocial Re-Evaluation:     Psychosocial Re-Evaluation    Row Name 07/19/16 1501             Psychosocial Re-Evaluation   Comments Suzanne Bernard said she is sorry that she can't attend Cardiac Rehab lately since she has doctor's appts.  I told Suzanne Bernard about [employer] [facility] for activities since she said she is new to the area and it is hard for she and her husband to meet friends.           Vocational Rehabilitation: Provide vocational rehab assistance to qualifying candidates.   Vocational Rehab Evaluation & Intervention:     Vocational Rehab - 07/05/16 1547      Initial Vocational Rehab Evaluation & Intervention   Assessment shows need for Vocational Rehabilitation No      Education: Education Goals: Education classes will be provided on a weekly basis, covering required topics. Participant will state understanding/return demonstration of topics presented.  Learning Barriers/Preferences:     Learning Barriers/Preferences - 07/05/16 1540      Learning Barriers/Preferences   Learning Barriers None   Learning Preferences Individual Instruction      Education Topics: General Nutrition Guidelines/Fats and Fiber: -Group instruction provided by verbal, written material, models and posters to present the general guidelines for heart healthy nutrition. Gives an explanation and review of dietary fats and fiber.   Controlling Sodium/Reading Food Labels: -Group verbal and written material supporting the discussion of sodium use in heart healthy nutrition. Review and explanation with models, verbal and written materials for utilization of the food label.   Exercise Physiology & Risk  Factors: - Group verbal and written instruction with models to review the exercise physiology of the cardiovascular system and associated critical values. Details cardiovascular disease risk factors and the  goals associated with each risk factor.   Aerobic Exercise & Resistance Training: - Gives group verbal and written discussion on the health impact of inactivity. On the components of aerobic and resistive training programs and the benefits of this training and how to safely progress through these programs.   Flexibility, Balance, General Exercise Guidelines: - Provides group verbal and written instruction on the benefits of flexibility and balance training programs. Provides general exercise guidelines with specific guidelines to those with heart or lung disease. Demonstration and skill practice provided.   Stress Management: - Provides group verbal and written instruction about the health risks of elevated stress, cause of high stress, and healthy ways to reduce stress.   Depression: - Provides group verbal and written instruction on the correlation between heart/lung disease and depressed mood, treatment options, and the stigmas associated with seeking treatment.   Anatomy & Physiology of the Heart: - Group verbal and written instruction and models provide basic cardiac anatomy and physiology, with the coronary electrical and arterial systems. Review of: AMI, Angina, Valve disease, Heart Failure, Cardiac Arrhythmia, Pacemakers, and the ICD.   Cardiac Procedures: - Group verbal and written instruction and models to describe the testing methods done to diagnose heart disease. Reviews the outcomes of the test results. Describes the treatment choices: Medical Management, Angioplasty, or Coronary Bypass Surgery. Flowsheet Row Cardiac Rehab from 07/26/2016 in [facility] Cardiac and Pulmonary Rehab  Date  07/17/16  Educator  MJA  Instruction Review Code  2- meets goals/outcomes      Cardiac  Medications: - Group verbal and written instruction to review commonly prescribed medications for heart disease. Reviews the medication, class of the drug, and side effects. Includes the steps to properly store meds and maintain the prescription regimen. Flowsheet Row Cardiac Rehab from 07/26/2016 in [facility] Cardiac and Pulmonary Rehab  Date  07/26/16  Educator  SB  Instruction Review Code  2- meets goals/outcomes      Go Sex-Intimacy & Heart Disease, Get SMART - Goal Setting: - Group verbal and written instruction through game format to discuss heart disease and the return to sexual intimacy. Provides group verbal and written material to discuss and apply goal setting through the application of the S.M.A.R.T. Method. Flowsheet Row Cardiac Rehab from 07/26/2016 in [facility] Cardiac and Pulmonary Rehab  Date  07/17/16  Educator  MJA  Instruction Review Code  2- meets goals/outcomes      Other Matters of the Heart: - Provides group verbal, written materials and models to describe Heart Failure, Angina, Valve Disease, and Diabetes in the realm of heart disease. Includes description of the disease process and treatment options available to the cardiac patient.   Exercise & Equipment Safety: - Individual verbal instruction and demonstration of equipment use and safety with use of the equipment. Flowsheet Row Cardiac Rehab from 07/26/2016 in [facility] Cardiac and Pulmonary Rehab  Date  07/05/16  Educator  C. EnterkinRN  Instruction Review Code  1- partially meets, needs review/practice      Infection Prevention: - Provides verbal and written material to individual with discussion of infection control including proper hand washing and proper equipment cleaning during exercise session. Flowsheet Row Cardiac Rehab from 07/26/2016 in [facility] Cardiac and Pulmonary Rehab  Date  07/05/16  Educator  C. EnterkinRN  Instruction Review Code  2- meets goals/outcomes      Falls Prevention: - Provides verbal and  written material to individual with discussion of falls prevention and safety. Flowsheet Row [facility]  Date  07/05/16  Educator  C. EnterkinRN  Instruction Review Code  2- meets goals/outcomes      Diabetes: - Individual verbal and written instruction to review signs/symptoms of diabetes, desired ranges of glucose level fasting, after meals and with exercise. Advice that pre and post exercise glucose checks will be done for 3 sessions at entry of program. Flowsheet Row [facility]  Date  07/05/16  Educator  Salena Saner. ENterkinRN  Instruction Review Code  1- partially meets, needs review/practice       Knowledge Questionnaire Score:     Knowledge Questionnaire Score - 07/05/16 1540      Knowledge Questionnaire Score   Pre Score 20      Core Components/Risk Factors/Patient Goals at Admission:     Personal Goals and Risk Factors at Admission - 07/05/16 1545      Core Components/Risk Factors/Patient Goals on Admission    Weight Management Yes   Intervention Weight Management: Develop a combined nutrition and exercise program designed to reach desired caloric intake, while maintaining appropriate intake of nutrient and fiber, sodium and fats, and appropriate energy expenditure required for the weight goal.;Weight Management: Provide education and appropriate resources to help participant work on and attain dietary goals.;Weight Management/Obesity: Establish reasonable short term and long term weight goals.;Obesity: Provide education and appropriate resources to help participant work on and attain dietary goals.   Admit Weight 180 lb 3.2 oz (81.7 kg)   Goal Weight: Short Term 170 lb (77.1 kg)   Sedentary Yes   Intervention Provide advice, education, support and counseling about physical activity/exercise needs.;Develop an individualized exercise prescription for aerobic and resistive  training based on initial evaluation findings, risk stratification, comorbidities and participant's personal goals.   Expected Outcomes Achievement of increased cardiorespiratory fitness and enhanced flexibility, muscular endurance and strength shown through measurements of functional capacity and personal statement of participant.   Increase Strength and Stamina Yes   Intervention Provide advice, education, support and counseling about physical activity/exercise needs.;Develop an individualized exercise prescription for aerobic and resistive training based on initial evaluation findings, risk stratification, comorbidities and participant's personal goals.   Expected Outcomes Achievement of increased cardiorespiratory fitness and enhanced flexibility, muscular endurance and strength shown through measurements of functional capacity and personal statement of participant.   Diabetes Yes   Intervention Provide education about signs/symptoms and action to take for hypo/hyperglycemia.;Provide education about proper nutrition, including hydration, and aerobic/resistive exercise prescription along with prescribed medications to achieve blood glucose in normal ranges: Fasting glucose 65-99 mg/dL   Expected Outcomes Short Term: Participant verbalizes understanding of the signs/symptoms and immediate care of hyper/hypoglycemia, proper foot care and importance of medication, aerobic/resistive exercise and nutrition plan for blood glucose control.;Long Term: Attainment of HbA1C < 7%.   Hypertension Yes   Intervention Provide education on lifestyle modifcations including regular physical activity/exercise, weight management, moderate sodium restriction and increased consumption of fresh fruit, vegetables, and low fat dairy, alcohol moderation, and smoking cessation.;Monitor prescription use compliance.   Expected Outcomes Short Term: Continued assessment and intervention until BP is < 140/590mm HG in hypertensive  participants. < 130/280mm HG in hypertensive participants with diabetes, heart failure or chronic kidney disease.;Long Term: Maintenance of blood pressure at goal levels.   Lipids Yes   Intervention Provide education and support for participant on nutrition & aerobic/resistive exercise along with prescribed medications to achieve LDL 70mg , HDL >40mg .   Expected Outcomes  Short Term: Participant states understanding of desired cholesterol values and is compliant with medications prescribed. Participant is following exercise prescription and nutrition guidelines.;Long Term: Cholesterol controlled with medications as prescribed, with individualized exercise RX and with personalized nutrition plan. Value goals: LDL < 70mg , HDL > 40 mg.      Core Components/Risk Factors/Patient Goals Review:      Goals and Risk Factor Review    Row Name 07/19/16 1502             Core Components/Risk Factors/Patient Goals Review   Review Laniyah said she is sorry that she can't attend Cardiac Rehab lately since she has doctor's appts.        Expected Outcomes Cont heart healthy lifestyle.          Core Components/Risk Factors/Patient Goals at Discharge (Final Review):      Goals and Risk Factor Review - 07/19/16 1502      Core Components/Risk Factors/Patient Goals Review   Review Suzanne Bernard said she is sorry that she can't attend Cardiac Rehab lately since she has doctor's appts.    Expected Outcomes Cont heart healthy lifestyle.      ITP Comments:     ITP Comments    Row Name 07/19/16 1500 07/26/16 1823 07/27/16 1209 08/02/16 0717     ITP Comments Suzanne Bernard said she is sorry that she can't attend Cardiac Rehab lately since she has doctor's appts.  Required glucose tabs for low blood sugar post exercise. Suzanne Bernard is progressing well with exercise. 30 day review. Continue with ITP unless changes noted by Medical Director at signature of review.       Comments:

## 2016-08-02 NOTE — Progress Notes (Signed)
Daily Session Note  Patient Details  Name: Suzanne Bernard MRN: [id number] Date of Birth: 07/26/1951 Referring Provider:   Flowsheet Row Cardiac Rehab from 07/05/2016 in [facility] Cardiac and Pulmonary Rehab  Referring Provider  Cavender      Encounter Date: 08/02/2016  Check In:     Session Check In - 08/02/16 1711      Check-In   Location [facility]-Cardiac & Pulmonary Rehab   Staff Present Nyoka Cowden, RN, BSN, MA;Latasha Puskas, RN, Vickki Hearing, BA, ACSM CEP, Exercise Physiologist   Supervising physician immediately available to respond to emergencies See telemetry face sheet for immediately available ER MD   Medication changes reported     No   Fall or balance concerns reported    No   Warm-up and Cool-down Performed on first and last piece of equipment   Resistance Training Performed Yes   VAD Patient? No     Pain Assessment   Currently in Pain? No/denies         Goals Met:  Strength training completed today  Goals Unmet:  Not Applicable  Comments:    Dr. Emily Filbert is Medical Director for [facility].

## 2016-08-03 ENCOUNTER — Encounter: Admitting: *Deleted

## 2016-08-03 DIAGNOSIS — Z955 Presence of coronary angioplasty implant and graft: Secondary | ICD-10-CM

## 2016-08-03 NOTE — Progress Notes (Signed)
Daily Session Note  Patient Details  Name: Suzanne Bernard MRN: [id number] Date of Birth: Jun 27, 1951 Referring Provider:   Flowsheet Row Cardiac Rehab from 07/05/2016 in [facility] Cardiac and Pulmonary Rehab  Referring Provider  Cavender      Encounter Date: 08/03/2016  Check In:     Session Check In - 08/03/16 1712      Check-In   Location [facility]-Cardiac & Pulmonary Rehab   Staff Present Nyoka Cowden, RN, BSN, MA;Waver Dibiasio, RN, Vickki Hearing, BA, ACSM CEP, Exercise Physiologist   Supervising physician immediately available to respond to emergencies See telemetry face sheet for immediately available ER MD   Medication changes reported     No   Fall or balance concerns reported    No   Warm-up and Cool-down Performed on first and last piece of equipment   Resistance Training Performed Yes   VAD Patient? No     Pain Assessment   Currently in Pain? No/denies         Goals Met:  Proper associated with RPD/PD & O2 Sat  Goals Unmet:  Not Applicable  Comments:     Dr. Emily Filbert is Medical Director for [facility].

## 2016-08-07 ENCOUNTER — Encounter: Admitting: *Deleted

## 2016-08-07 DIAGNOSIS — Z955 Presence of coronary angioplasty implant and graft: Secondary | ICD-10-CM

## 2016-08-07 NOTE — Progress Notes (Signed)
Daily Session Note  Patient Details  Name: Suzanne Bernard MRN: [id number] Date of Birth: Dec 10, 1950 Referring Provider:   Flowsheet Row Cardiac Rehab from 07/05/2016 in [facility] Cardiac and Pulmonary Rehab  Referring Provider  Cavender      Encounter Date: 08/07/2016  Check In:     Session Check In - 08/07/16 1641      Check-In   Location [facility]-Cardiac & Pulmonary Rehab   Staff Present Gerlene Burdock, RN, Moises Blood, BS, ACSM CEP, Exercise Physiologist;[name], RN, BSN, CCRP   Supervising physician immediately available to respond to emergencies See telemetry face sheet for immediately available ER MD   Medication changes reported     No   Fall or balance concerns reported    No   Warm-up and Cool-down Performed on first and last piece of equipment   Resistance Training Performed Yes   VAD Patient? No     Pain Assessment   Currently in Pain? No/denies   Multiple Pain Sites No         Goals Met:  Independence with exercise equipment Exercise tolerated well No report of cardiac concerns or symptoms Strength training completed today  Goals Unmet:  Not Applicable  Comments: Pt able to follow exercise prescription today without complaint.  Will continue to monitor for progression.    Dr. Emily Filbert is Medical Director for [place] and [facility].

## 2016-08-07 NOTE — Progress Notes (Signed)
Daily Session Note  Patient Details  Name: Suzanne Bernard MRN: [id number] Date of Birth: 08/14/1951 Referring Provider:   Flowsheet Row Cardiac Rehab from 07/05/2016 in [facility] Cardiac and Pulmonary Rehab  Referring Provider  Cavender      Encounter Date: 08/07/2016  Check In:     Session Check In - 08/07/16 1641      Check-In   Location [facility]-Cardiac & Pulmonary Rehab   Staff Present Gerlene Burdock, RN, Moises Blood, BS, ACSM CEP, Exercise Physiologist;[name], RN, BSN, CCRP   Supervising physician immediately available to respond to emergencies See telemetry face sheet for immediately available ER MD   Medication changes reported     No   Fall or balance concerns reported    No   Warm-up and Cool-down Performed on first and last piece of equipment   Resistance Training Performed Yes   VAD Patient? No     Pain Assessment   Currently in Pain? No/denies   Multiple Pain Sites No         Goals Met:  Queuing for purse lip breathing  Goals Unmet:  [place] PD  Comments: 8/10 chest pain while riding exercise recumbent bicycle. Chest pain went away when she stopped exercising on recumbent bike. Izora Gala will call her MD tomorrow since she is short of breath at times also. Shelsy still has some heart blockage.    Dr. Emily Filbert is Medical Director for [place] and [facility].

## 2016-08-07 NOTE — Progress Notes (Signed)
Daily Session Note  Patient Details  Name: Allyce Bochicchio MRN: [id number] Date of Birth: July 14, 1951 Referring Provider:   Flowsheet Row Cardiac Rehab from 07/05/2016 in [facility] Cardiac and Pulmonary Rehab  Referring Provider  Cavender      Encounter Date: 08/07/2016  Check In:     Session Check In - 08/07/16 1641      Check-In   Location [facility]-Cardiac & Pulmonary Rehab   Staff Present Gerlene Burdock, RN, Moises Blood, BS, ACSM CEP, Exercise Physiologist;[name], RN, BSN, CCRP   Supervising physician immediately available to respond to emergencies See telemetry face sheet for immediately available ER MD   Medication changes reported     No   Fall or balance concerns reported    No   Warm-up and Cool-down Performed on first and last piece of equipment   Resistance Training Performed Yes   VAD Patient? No     Pain Assessment   Currently in Pain? No/denies   Multiple Pain Sites No         Goals Met:  Goals Unmet:  [place] PD  Comments: 8/10 chest pain while riding exercise recumbent bicycle. Chest pain was relieved with rest. Izora Gala will call her MD tomorrow since she is short of breath at times also. Hannia still has some heart blockage.    Dr. Emily Filbert is Medical Director for [place] and [facility].

## 2016-08-09 ENCOUNTER — Encounter

## 2016-08-10 ENCOUNTER — Encounter

## 2016-08-10 ENCOUNTER — Encounter: Payer: Self-pay | Admitting: *Deleted

## 2016-08-10 NOTE — Progress Notes (Unsigned)
Cardiac Individual Treatment Plan  Patient Details  Name: Suzanne Bernard MRN: [id number] Date of Birth: 1951/03/04 Referring Provider:   Flowsheet Row Cardiac Rehab from 07/05/2016 in [facility] Cardiac and Pulmonary Rehab  Referring Provider  Cavender      Initial Encounter Date:  Flowsheet Row Cardiac Rehab from 07/05/2016 in [facility] Cardiac and Pulmonary Rehab  Date  07/05/16  Referring Provider  Cavender      Visit Diagnosis: No diagnosis found.  Patient's Home Medications on Admission:  Current Outpatient Prescriptions:  .  allopurinol (ZYLOPRIM) 300 MG tablet, Take by mouth., Disp: , Rfl:  .  amLODipine (NORVASC) 10 MG tablet, Take by mouth., Disp: , Rfl:  .  carvedilol (COREG) 12.5 MG tablet, Take by mouth., Disp: , Rfl:  .  colestipol (COLESTID) 1 g tablet, TAKE 4 TABLETS DAILY, Disp: , Rfl:  .  gabapentin (NEURONTIN) 300 MG capsule, Take by mouth., Disp: , Rfl:  .  gemfibrozil (LOPID) 600 MG tablet, Take by mouth., Disp: , Rfl:  .  glimepiride (AMARYL) 4 MG tablet, Take by mouth 2 (two) times daily., Disp: , Rfl:  .  glucose blood (FREESTYLE LITE) test strip, , Disp: , Rfl:  .  Insulin Glargine (LANTUS SOLOSTAR) 100 UNIT/ML Solostar Pen, Inject into the skin., Disp: , Rfl:  .  losartan-hydrochlorothiazide (HYZAAR) 100-25 MG tablet, Take by mouth., Disp: , Rfl:  .  lubiprostone (AMITIZA) 24 MCG capsule, TAKE 1 CAPSULE TWICE A DAY WITH MEALS, Disp: , Rfl:  .  metFORMIN (GLUCOPHAGE) 1000 MG tablet, TAKE 1 TABLET TWICE A DAY, Disp: , Rfl:  .  montelukast (SINGULAIR) 10 MG tablet, Take by mouth., Disp: , Rfl:  .  ranitidine (ZANTAC) 150 MG capsule, Take by mouth., Disp: , Rfl:  .  sitaGLIPtin (JANUVIA) 100 MG tablet, once daily., Disp: , Rfl:   Past Medical History: No past medical history on file.  Tobacco Use: History  Smoking Status  . Not on file  Smokeless Tobacco  . Not on file    Labs: Recent Review Flowsheet Data    There is no flowsheet data to display.        Exercise Target Goals:    Exercise Program Goal: Individual exercise prescription set with THRR, safety & activity barriers. Participant demonstrates ability to understand and report RPE using BORG scale, to self-measure pulse accurately, and to acknowledge the importance of the exercise prescription.  Exercise Prescription Goal: Starting with aerobic activity 30 plus minutes a day, 3 days per week for initial exercise prescription. Provide home exercise prescription and guidelines that participant acknowledges understanding prior to discharge.  Activity Barriers & Risk Stratification:     Activity Barriers & Cardiac Risk Stratification - 07/05/16 1539      Activity Barriers & Cardiac Risk Stratification   Activity Barriers Back Problems;Deconditioning;Neck/Spine Problems;Shortness of Breath;Joint Problems      6 Minute Walk:     6 Minute Walk    Row Name 07/05/16 1516         6 Minute Walk   Distance 1333 feet     Walk Time 6 minutes     # of Rest Breaks 0     MPH 2.5     METS 2.94     RPE 8     VO2 Peak 10.3     Symptoms No     Resting HR 61 bpm     Resting BP 122/60     Max Ex. HR 90 bpm  Max Ex. BP 128/54        Initial Exercise Prescription:     Initial Exercise Prescription - 07/05/16 1500      Date of Initial Exercise RX and Referring Provider   Date 07/05/16   Referring Provider Cavender     Treadmill   MPH 2.5   Grade 0   Minutes 15   METs 2.91     Recumbant Bike   Level 2   RPM 60   Minutes 15   METs 2.98     NuStep   Level 3   Minutes 15   METs 3     Arm Ergometer   Level 1   Minutes 15   METs 2.9     REL-XR   Level 2   Minutes 15   METs 3     T5 Nustep   Level 2   Minutes 15   METs 3     Biostep-RELP   Level 3   Minutes 15   METs 3     Prescription Details   Frequency (times per week) 3   Duration Progress to 45 minutes of aerobic exercise without signs/symptoms of physical distress     Intensity    THRR 40-80% of Max Heartrate 99-138   Ratings of Perceived Exertion 11-13   Perceived Dyspnea 0-4     Progression   Progression Continue to progress workloads to maintain intensity without signs/symptoms of physical distress.     Resistance Training   Training Prescription Yes   Weight 3   Reps 10-12      Perform Capillary Blood Glucose checks as needed.  Exercise Prescription Changes:     Exercise Prescription Changes    Row Name 07/27/16 1200 08/10/16 1100           Exercise Review   Progression Yes Yes        Response to Exercise   Blood Pressure (Admit) 118/60 118/64      Blood Pressure (Exercise) 124/62 138/70      Blood Pressure (Exit) 126/64 130/62      Heart Rate (Admit) 53 bpm 70 bpm      Heart Rate (Exercise) 111 bpm 117 bpm      Heart Rate (Exit) 83 bpm 81 bpm      Rating of Perceived Exertion (Exercise) 15 15      Symptoms none chest pain   8/10 - resolved with rest      Duration Progress to 45 minutes of aerobic exercise without signs/symptoms of physical distress Progress to 45 minutes of aerobic exercise without signs/symptoms of physical distress      Intensity THRR unchanged THRR unchanged        Progression   Progression Continue to progress workloads to maintain intensity without signs/symptoms of physical distress. Continue to progress workloads to maintain intensity without signs/symptoms of physical distress.      Average METs 2.2 2.48        Resistance Training   Training Prescription Yes Yes      Weight 2 2      Reps 10-15 10-15        Interval Training   Interval Training No No        Recumbant Bike   Level  - 1      RPM  - 60      Minutes  - 15        NuStep   Level 1 1  Minutes 15 15      METs 2.4 1.9        Arm Ergometer   Level 1  -      Minutes 15  -      METs 2  -         Exercise Comments:     Exercise Comments    Row Name 07/27/16 1208 08/10/16 1145         Exercise Comments Suzanne Bernard is progressing well  with exercise. Suzanne Bernard was recommended to follow up with her cardiologist re: chest pain.           Discharge Exercise Prescription (Final Exercise Prescription Changes):     Exercise Prescription Changes - 08/10/16 1100      Exercise Review   Progression Yes     Response to Exercise   Blood Pressure (Admit) 118/64   Blood Pressure (Exercise) 138/70   Blood Pressure (Exit) 130/62   Heart Rate (Admit) 70 bpm   Heart Rate (Exercise) 117 bpm   Heart Rate (Exit) 81 bpm   Rating of Perceived Exertion (Exercise) 15   Symptoms chest pain   8/10 - resolved with rest   Duration Progress to 45 minutes of aerobic exercise without signs/symptoms of physical distress   Intensity THRR unchanged     Progression   Progression Continue to progress workloads to maintain intensity without signs/symptoms of physical distress.   Average METs 2.48     Resistance Training   Training Prescription Yes   Weight 2   Reps 10-15     Interval Training   Interval Training No     Recumbant Bike   Level 1   RPM 60   Minutes 15     NuStep   Level 1   Minutes 15   METs 1.9      Nutrition:  Target Goals: Understanding of nutrition guidelines, daily intake of sodium <1559m, cholesterol <2080m calories 30% from fat and 7% or less from saturated fats, daily to have 5 or more servings of fruits and vegetables.  Biometrics:     Pre Biometrics - 07/05/16 1516      Pre Biometrics   Height 5' 3.5" (1.613 m)   Weight 180 lb 3.2 oz (81.7 kg)   Waist Circumference 43.75 inches   Hip Circumference 43 inches   Waist to Hip Ratio 1.02 %   BMI (Calculated) 31.5   Single Leg Stand 19.27 seconds       Nutrition Therapy Plan and Nutrition Goals:     Nutrition Therapy & Goals - 07/18/16 1132      Nutrition Therapy   Diet 1350 kcal Diabetes and heart healthy diet   Protein (specify units) 6   Fiber 20 grams   Whole Grain Foods 3 servings   Saturated Fats 11 max. grams   Fruits and Vegetables  8 servings/day   Sodium 1500 grams     Personal Nutrition Goals   Personal Goal #1 Control food portions by using small plates and eating generous portions of low-cal vegetables.    Comments patient reports feeling hungry almost constantly, and also states she has gastroparesis, but no issues with nausea or vomiting at this time. Advised small amounts of food often during the day, and limiting fiber intake.      Intervention Plan   Intervention Prescribe, educate and counsel regarding individualized specific dietary modifications aiming towards targeted core components such as weight, hypertension, lipid management, diabetes, heart failure and other comorbidities.;Nutrition handout(s)  given to patient.   Expected Outcomes Short Term Goal: Understand basic principles of dietary content, such as calories, fat, sodium, cholesterol and nutrients.;Short Term Goal: A plan has been developed with personal nutrition goals set during dietitian appointment.;Long Term Goal: Adherence to prescribed nutrition plan.      Nutrition Discharge: Rate Your Plate Scores:     Nutrition Assessments - 07/05/16 1545      Rate Your Plate Scores   Pre Score 66   Pre Score % 73 %      Nutrition Goals Re-Evaluation:   Psychosocial: Target Goals: Acknowledge presence or absence of depression, maximize coping skills, provide positive support system. Participant is able to verbalize types and ability to use techniques and skills needed for reducing stress and depression.  Initial Review & Psychosocial Screening:     Initial Psych Review & Screening - 07/05/16 1547      Initial Review   Current issues with Current Stress Concerns   Source of Stress Concerns Chronic Illness   Comments Suzanne Bernard's husband is currently in the [facility] at [facility] [facility] for At[employer]utter- she reports.     Family Dynamics   Good Support System? Yes     Barriers   Psychosocial barriers to participate in program The patient should benefit  from training in stress management and relaxation.     Screening Interventions   Interventions Encouraged to exercise      Quality of Life Scores:     Quality of Life - 07/05/16 1645      Quality of Life Scores   Health/Function Pre 16.2 %   Socioeconomic Pre 21.79 %   Psych/Spiritual Pre 20.57 %   Family Pre 14.9 %   GLOBAL Pre 18.06 %      PHQ-9: Recent Review Flowsheet Data    Depression screen [facility] 2/9 07/05/2016   Decreased Interest 0   Down, Depressed, Hopeless 1   PHQ - 2 Score 1   Altered sleeping 1   Tired, decreased energy 3   Change in appetite 2   Feeling bad or failure about yourself  0   Trouble concentrating 1   Moving slowly or fidgety/restless 0   Suicidal thoughts 0   PHQ-9 Score 8      Psychosocial Evaluation and Intervention:     Psychosocial Evaluation - 08/02/16 1711      Psychosocial Evaluation & Interventions   Interventions Encouraged to exercise with the program and follow exercise prescription;Stress management education   Comments Counselor met with Suzanne Bernard today for initial psychosocial evaluation.  She is a 65 year old who had (3) stents inserted on 8/4.  She has a spouse of 68 years; a daughter who lives closeby and is also actively involved in her local church community.  Suzanne Bernard reports multiple health issues in addition to her cardiac issues; she has asthma; diabetes; obesity; neuropathy and states she needs to have both knees replaced.  She reports not sleeping well due to the neuropathy and having a "horrible" appetite wherein she "binge eats" quite often.  Suzanne Bernard states her Dr. recently recommended medications for her mood and she declined stating she's "tired of taking so many medications."  Counselor educated client on the impact of depression on her health; her sleep; and her appetite, which are all areas she is experiencing challenges.  Counselor and Suzanne Bernard agreed to give this program (2) weeks to see if consistent exercise and trying  an OTC natural sleep aid will help with her  sleep and her mood.  If not, Suzanne Bernard agreed to consult with her doctor again about medications for her mood.  Suzanne Bernard has goals to lose some weight and exercise consistently while in this program.  Counselor will be following with her throughout the course of this program.     Continued Psychosocial Services Needed Yes  Follow up with Suzanne Bernard on her mood; sleep and appetite after two weeks in this program.  If no improvement; Suzanne Bernard agreed to consult with her Dr. Donivan Bernard: Rx for mood.        Psychosocial Re-Evaluation:     Psychosocial Re-Evaluation    [place] Name 07/19/16 1501             Psychosocial Re-Evaluation   Comments Suzanne Bernard said she is sorry that she can't attend Cardiac Rehab lately since she has doctor's appts.  I told Suzanne Bernard about Suzanne Bernard for activities since she said she is new to the area and it is hard for she and her husband to meet friends.           Vocational Rehabilitation: Provide vocational rehab assistance to qualifying candidates.   Vocational Rehab Evaluation & Intervention:     Vocational Rehab - 07/05/16 1547      Initial Vocational Rehab Evaluation & Intervention   Assessment shows need for Vocational Rehabilitation No      Education: Education Goals: Education classes will be provided on a weekly basis, covering required topics. Participant will state understanding/return demonstration of topics presented.  Learning Barriers/Preferences:     Learning Barriers/Preferences - 07/05/16 1540      Learning Barriers/Preferences   Learning Barriers None   Learning Preferences Individual Instruction      Education Topics: General Nutrition Guidelines/Fats and Fiber: -Group instruction provided by verbal, written material, models and posters to present the general guidelines for heart healthy nutrition. Gives an explanation and review of dietary fats and fiber. Flowsheet Row Cardiac Rehab from 08/07/2016 in [facility]  Cardiac and Pulmonary Rehab  Date  08/07/16  Educator  PI  Instruction Review Code  2- meets goals/outcomes      Controlling Sodium/Reading Food Labels: -Group verbal and written material supporting the discussion of sodium use in heart healthy nutrition. Review and explanation with models, verbal and written materials for utilization of the food label.   Exercise Physiology & Risk Factors: - Group verbal and written instruction with models to review the exercise physiology of the cardiovascular system and associated critical values. Details cardiovascular disease risk factors and the goals associated with each risk factor.   Aerobic Exercise & Resistance Training: - Gives group verbal and written discussion on the health impact of inactivity. On the components of aerobic and resistive training programs and the benefits of this training and how to safely progress through these programs.   Flexibility, Balance, General Exercise Guidelines: - Provides group verbal and written instruction on the benefits of flexibility and balance training programs. Provides general exercise guidelines with specific guidelines to those with heart or lung disease. Demonstration and skill practice provided.   Stress Management: - Provides group verbal and written instruction about the health risks of elevated stress, cause of high stress, and healthy ways to reduce stress.   Depression: - Provides group verbal and written instruction on the correlation between heart/lung disease and depressed mood, treatment options, and the stigmas associated with seeking treatment.   Anatomy & Physiology of the Heart: - Group verbal and written instruction and models provide  basic cardiac anatomy and physiology, with the coronary electrical and arterial systems. Review of: AMI, Angina, Valve disease, Heart Failure, Cardiac Arrhythmia, Pacemakers, and the ICD.   Cardiac Procedures: - Group verbal and written  instruction and models to describe the testing methods done to diagnose heart disease. Reviews the outcomes of the test results. Describes the treatment choices: Medical Management, Angioplasty, or Coronary Bypass Surgery. Flowsheet Row Cardiac Rehab from 08/07/2016 in [facility] Cardiac and Pulmonary Rehab  Date  07/17/16  Educator  [place]  Instruction Review Code  2- meets goals/outcomes      Cardiac Medications: - Group verbal and written instruction to review commonly prescribed medications for heart disease. Reviews the medication, class of the drug, and side effects. Includes the steps to properly store meds and maintain the prescription regimen. Flowsheet Row Cardiac Rehab from 08/07/2016 in [facility] Cardiac and Pulmonary Rehab  Date  07/26/16  Educator  [place]  Instruction Review Code  2- meets goals/outcomes      Go Sex-Intimacy & Heart Disease, Get SMART - Goal Setting: - Group verbal and written instruction through game format to discuss heart disease and the return to sexual intimacy. Provides group verbal and written material to discuss and apply goal setting through the application of the S.M.A.R.T. Method. Flowsheet Row Cardiac Rehab from 08/07/2016 in [facility] Cardiac and Pulmonary Rehab  Date  07/17/16  Educator  [place]  Instruction Review Code  2- meets goals/outcomes      Other Matters of the Heart: - Provides group verbal, written materials and models to describe Heart Failure, Angina, Valve Disease, and Diabetes in the realm of heart disease. Includes description of the disease process and treatment options available to the cardiac patient.   Exercise & Equipment Safety: - Individual verbal instruction and demonstration of equipment use and safety with use of the equipment. Flowsheet Row Cardiac Rehab from 08/07/2016 in [facility] Cardiac and Pulmonary Rehab  Date  07/05/16  Educator  C. EnterkinRN  Instruction Review Code  1- partially meets, needs review/practice      Infection  Prevention: - Provides verbal and written material to individual with discussion of infection control including proper hand washing and proper equipment cleaning during exercise session. Flowsheet Row Cardiac Rehab from 08/07/2016 in [facility] Cardiac and Pulmonary Rehab  Date  07/05/16  Educator  C. EnterkinRN  Instruction Review Code  2- meets goals/outcomes      Falls Prevention: - Provides verbal and written material to individual with discussion of falls prevention and safety. Flowsheet Row Cardiac Rehab from 08/07/2016 in [facility] Cardiac and Pulmonary Rehab  Date  07/05/16  Educator  [place]. [place]  Instruction Review Code  2- meets goals/outcomes      Diabetes: - Individual verbal and written instruction to review signs/symptoms of diabetes, desired ranges of glucose level fasting, after meals and with exercise. Advice that pre and post exercise glucose checks will be done for 3 sessions at entry of program. Flowsheet Row Cardiac Rehab from 08/07/2016 in [facility] Cardiac and Pulmonary Rehab  Date  07/05/16  Educator  Loletha Grayer ENterkinRN  Instruction Review Code  1- partially meets, needs review/practice       Knowledge Questionnaire Score:     Knowledge Questionnaire Score - 07/05/16 1540      Knowledge Questionnaire Score   Pre Score 20      Core Components/Risk Factors/Patient Goals at Admission:     Personal Goals and Risk Factors at Admission - 07/05/16 1545      Core Components/Risk  Factors/Patient Goals on Admission    Weight Management Yes   Intervention Weight Management: Develop a combined nutrition and exercise program designed to reach desired caloric intake, while maintaining appropriate intake of nutrient and fiber, sodium and fats, and appropriate energy expenditure required for the weight goal.;Weight Management: Provide education and appropriate resources to help participant work on and attain dietary goals.;Weight Management/Obesity: Establish reasonable short term and  long term weight goals.;Obesity: Provide education and appropriate resources to help participant work on and attain dietary goals.   Admit Weight 180 lb 3.2 oz (81.7 kg)   Goal Weight: Short Term 170 lb (77.1 kg)   Sedentary Yes   Intervention Provide advice, education, support and counseling about physical activity/exercise needs.;Develop an individualized exercise prescription for aerobic and resistive training based on initial evaluation findings, risk stratification, comorbidities and participant's personal goals.   Expected Outcomes Achievement of increased cardiorespiratory fitness and enhanced flexibility, muscular endurance and strength shown through measurements of functional capacity and personal statement of participant.   Increase Strength and Stamina Yes   Intervention Provide advice, education, support and counseling about physical activity/exercise needs.;Develop an individualized exercise prescription for aerobic and resistive training based on initial evaluation findings, risk stratification, comorbidities and participant's personal goals.   Expected Outcomes Achievement of increased cardiorespiratory fitness and enhanced flexibility, muscular endurance and strength shown through measurements of functional capacity and personal statement of participant.   Diabetes Yes   Intervention Provide education about signs/symptoms and action to take for hypo/hyperglycemia.;Provide education about proper nutrition, including hydration, and aerobic/resistive exercise prescription along with prescribed medications to achieve blood glucose in normal ranges: Fasting glucose 65-99 mg/dL   Expected Outcomes Short Term: Participant verbalizes understanding of the signs/symptoms and immediate care of hyper/hypoglycemia, proper foot care and importance of medication, aerobic/resistive exercise and nutrition plan for blood glucose control.;Long Term: Attainment of HbA1C < 7%.   Hypertension Yes    Intervention Provide education on lifestyle modifcations including regular physical activity/exercise, weight management, moderate sodium restriction and increased consumption of fresh fruit, vegetables, and low fat dairy, alcohol moderation, and smoking cessation.;Monitor prescription use compliance.   Expected Outcomes Short Term: Continued assessment and intervention until BP is < 140/19m HG in hypertensive participants. < 130/834mHG in hypertensive participants with diabetes, heart failure or chronic kidney disease.;Long Term: Maintenance of blood pressure at goal levels.   Lipids Yes   Intervention Provide education and support for participant on nutrition & aerobic/resistive exercise along with prescribed medications to achieve LDL <705mHDL >68m71m Expected Outcomes Short Term: Participant states understanding of desired cholesterol values and is compliant with medications prescribed. Participant is following exercise prescription and nutrition guidelines.;Long Term: Cholesterol controlled with medications as prescribed, with individualized exercise RX and with personalized nutrition plan. Value goals: LDL < 70mg8mL > 40 mg.      Core Components/Risk Factors/Patient Goals Review:      Goals and Risk Factor Review    Row Name 07/19/16 1502 08/07/16 1818 08/10/16 1306         Core Components/Risk Factors/Patient Goals Review   Review Suzanne Bernard she is sorry that she can't attend Cardiac Rehab lately since she has doctor's appts.  8/10 chest pain while riding exercise recumbent bicycle. Suzanne Bernard call her MD tomorrow since she is short of breath at times also. Suzanne Bernard has some heart blockage.  Suzanne Gala MD Suzanne Bernard and she is short of breath. Monday Nuclear Stress test and will see CArdiologist on  Wednesday. I asked Karita to wait until she sees MD on Wed till when she will return to Cardiac Rehab.      Expected Outcomes Cont heart healthy lifestyle. Free of chest pain.   Free of chest pain.        Core Components/Risk Factors/Patient Goals at Discharge (Final Review):      Goals and Risk Factor Review - 08/10/16 1306      Core Components/Risk Factors/Patient Goals Review   Review Suzanne Bernard said MD Dr. Darrin Nipper called and she is short of breath. Monday Nuclear Stress test and will see CArdiologist on Wednesday. I asked [place] to wait until she sees MD on Wed till when she will return to Cardiac Rehab.    Expected Outcomes Free of chest pain.      ITP Comments:     ITP Comments    Row Name 07/19/16 1500 07/26/16 1823 07/27/16 1209 08/02/16 0717 08/02/16 1711   ITP Comments Suzanne Bernard said she is sorry that she can't attend Cardiac Rehab lately since she has doctor's appts.  Required glucose tabs for low blood sugar post exercise. Jonel is progressing well with exercise. 30 day review. Continue with ITP unless changes noted by Medical Director at signature of review. Some chest discomfort while she was talking to the [employer] Counselor while she was working out on the recumbent bike. Once I had her stop the bicycle her chest discomfort went away. Taresa reports tthe MD has her on a medicine holiday from her statins since she was soooo sore.    [place] Name 08/07/16 1816 08/10/16 1304         ITP Comments 8/10 chest pain while riding exercise recumbent bicycle. Chayah will call her MD tomorrow since she is short of breath at times also. Chantal still has some heart blockage.  Suzanne Bernard said MD Dr. Darrin Nipper called and she is short of breath. Monday Nuclear Stress test and will see CArdiologist on Wednesday. I asked Markiya to wait until she sees MD on Wed till when she will return to Cardiac Rehab.          Comments:

## 2016-08-14 ENCOUNTER — Encounter

## 2016-08-14 ENCOUNTER — Other Ambulatory Visit: Payer: Self-pay | Admitting: Geriatric Medicine

## 2016-08-14 DIAGNOSIS — Z1231 Encounter for screening mammogram for malignant neoplasm of breast: Secondary | ICD-10-CM

## 2016-08-16 ENCOUNTER — Encounter

## 2016-08-17 ENCOUNTER — Encounter

## 2016-08-21 ENCOUNTER — Encounter: Attending: Internal Medicine

## 2016-08-21 DIAGNOSIS — Z955 Presence of coronary angioplasty implant and graft: Secondary | ICD-10-CM | POA: Insufficient documentation

## 2016-08-23 ENCOUNTER — Encounter

## 2016-08-24 ENCOUNTER — Encounter: Payer: Self-pay | Admitting: *Deleted

## 2016-08-24 ENCOUNTER — Encounter

## 2016-08-24 ENCOUNTER — Telehealth: Payer: Self-pay

## 2016-08-24 NOTE — Telephone Encounter (Signed)
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   Dr said she was ok to return - I will have Suzanne Bernard check and call her back

## 2016-08-28 ENCOUNTER — Encounter

## 2016-08-30 ENCOUNTER — Encounter

## 2016-08-30 ENCOUNTER — Encounter: Payer: Self-pay | Admitting: *Deleted

## 2016-08-30 DIAGNOSIS — Z955 Presence of coronary angioplasty implant and graft: Secondary | ICD-10-CM

## 2016-08-30 NOTE — Progress Notes (Signed)
Cardiac Individual Treatment Plan  Patient Details  Name: Suzanne Bernard MRN: [id number] Date of Birth: 1951/10/21 Referring [name]:   Flowsheet Row Cardiac Rehab from 07/05/2016 in [facility] Cardiac [facility] Pulmonary Re[facility]  Referring [name]  Cavender      Initial Encounter Date:  Flowsheet Row Cardiac Rehab from 07/05/2016 in [facility] Cardiac and Pulmonary Rehab  Date  07/05/16  Referring [name]  Cavender      Visit Diagnosis: S/P coronary artery stent placement  Patient's Home Medications on Admission:  Current Outpatient Prescriptions:  .  allopurinol (ZYLOPRIM) 300 MG tablet, Take by mouth., Disp: , Rfl:  .  amLODipine (NORVASC) 10 MG tablet, Take by mouth., Disp: , Rfl:  .  carvedilol (COREG) 12.5 MG tablet, Take by mouth., Disp: , Rfl:  .  colestipol (COLESTID) 1 g tablet, TAKE 4 TABLETS DAILY, Disp: , Rfl:  .  gabapentin (NEURONTIN) 300 MG capsule, Take by mouth., Disp: , Rfl:  .  gemfibrozil (LOPID) 600 MG tablet, Take by mouth., Disp: , Rfl:  .  glimepiride (AMARYL) 4 MG tablet, Take by mouth 2 (two) times daily., Disp: , Rfl:  .  glucose blood (FREESTYLE LITE) test strip, , Disp: , Rfl:  .  Insulin Glargine (LANTUS SOLOSTAR) 100 UNIT/ML Solostar Pen, Inject into the skin., Disp: , Rfl:  .  losartan-hydrochlorothiazide (HYZAAR) 100-25 MG tablet, Take by mouth., Disp: , Rfl:  .  lubiprostone (AMITIZA) 24 MCG capsule, TAKE 1 CAPSULE TWICE A DAY WITH MEALS, Disp: , Rfl:  .  metFORMIN (GLUCOPHAGE) 1000 MG tablet, TAKE 1 TABLET TWICE A DAY, Disp: , Rfl:  .  montelukast (SINGULAIR) 10 MG tablet, Take by mouth., Disp: , Rfl:  .  ranitidine (ZANTAC) 150 MG capsule, Take by mouth., Disp: , Rfl:  .  sitaGLIPtin (JANUVIA) 100 MG tablet, once daily., Disp: , Rfl:   Past Medical History: No past medical history on file.  Tobacco Use: History  Smoking Status  . Not on file  Smokeless Tobacco  . Not on file    Labs: Recent Review Flowsheet Data    There is no flowsheet data to  display.       Exercise Target Goals:    Exercise Program Goal: Individual exercise prescription set with THRR, safety & activity barriers. Participant demonstrates ability to understand and report RPE using BORG scale, to self-measure pulse accurately, and to acknowledge the importance of the exercise prescription.  Exercise Prescription Goal: Starting with aerobic activity 30 plus minutes a day, 3 days per week for initial exercise prescription. Provide home exercise prescription and guidelines that participant acknowledges understanding prior to discharge.  Activity Barriers & Risk Stratification:     Activity Barriers & Cardiac Risk Stratification - 07/05/16 1539      Activity Barriers & Cardiac Risk Stratification   Activity Barriers Back Problems;Deconditioning;Neck/Spine Problems;Shortness of Breath;Joint Problems      6 Minute Walk:     6 Minute Walk    Row Name 07/05/16 1516         6 Minute Walk   Distance 1333 feet     Walk Time 6 minutes     # of Rest Breaks 0     MPH 2.5     METS 2.94     RPE 8     VO2 Peak 10.3     Symptoms No     Resting HR 61 bpm     Resting BP 122/60     Max Ex. HR 90 bpm  Max Ex. BP 128/54        Initial Exercise Prescription:     Initial Exercise Prescription - 07/05/16 1500      Date of Initial Exercise RX and Referring [name]   Date 07/05/16   Referring [name]     Treadmill   MPH 2.5   Grade 0   Minutes 15   METs 2.91     Recumbant Bike   Level 2   RPM 60   Minutes 15   METs 2.98     NuStep   Level 3   Minutes 15   METs 3     Arm Ergometer   Level 1   Minutes 15   METs 2.9     REL-XR   Level 2   Minutes 15   METs 3     T5 Nustep   Level 2   Minutes 15   METs 3     Biostep-RELP   Level 3   Minutes 15   METs 3     Prescription Details   Frequency (times per week) 3   Duration Progress to 45 minutes of aerobic exercise without signs/symptoms of physical distress      Intensity   THRR 40-80% of Max Heartrate 99-138   Ratings of Perceived Exertion 11-13   Perceived Dyspnea 0-4     Progression   Progression Continue to progress workloads to maintain intensity without signs/symptoms of physical distress.     Resistance Training   Training Prescription Yes   Weight 3   Reps 10-12      Perform Capillary Blood Glucose checks as needed.  Exercise Prescription Changes:     Exercise Prescription Changes    Row Name 07/27/16 1200 08/10/16 1100           Exercise Review   Progression Yes Yes        Response to Exercise   Blood Pressure (Admit) 118/60 118/64      Blood Pressure (Exercise) 124/62 138/70      Blood Pressure (Exit) 126/64 130/62      Heart Rate (Admit) 53 bpm 70 bpm      Heart Rate (Exercise) 111 bpm 117 bpm      Heart Rate (Exit) 83 bpm 81 bpm      Rating of Perceived Exertion (Exercise) 15 15      Symptoms none chest pain   8/10 - resolved with rest      Duration Progress to 45 minutes of aerobic exercise without signs/symptoms of physical distress Progress to 45 minutes of aerobic exercise without signs/symptoms of physical distress      Intensity THRR unchanged THRR unchanged        Progression   Progression Continue to progress workloads to maintain intensity without signs/symptoms of physical distress. Continue to progress workloads to maintain intensity without signs/symptoms of physical distress.      Average METs 2.2 2.48        Resistance Training   Training Prescription Yes Yes      Weight 2 2      Reps 10-15 10-15        Interval Training   Interval Training No No        Recumbant Bike   Level  - 1      RPM  - 60      Minutes  - 15        NuStep   Level 1 1  Minutes 15 15      METs 2.4 1.9        Arm Ergometer   Level 1  -      Minutes 15  -      METs 2  -         Exercise Comments:     Exercise Comments    Row Name 07/27/16 1208 08/10/16 1145         Exercise Comments [name] is  progressing well with exercise. [name] was recommended to follow up with her cardiologist re: chest pain.           Discharge Exercise Prescription (Final Exercise Prescription Changes):     Exercise Prescription Changes - 08/10/16 1100      Exercise Review   Progression Yes     Response to Exercise   Blood Pressure (Admit) 118/64   Blood Pressure (Exercise) 138/70   Blood Pressure (Exit) 130/62   Heart Rate (Admit) 70 bpm   Heart Rate (Exercise) 117 bpm   Heart Rate (Exit) 81 bpm   Rating of Perceived Exertion (Exercise) 15   Symptoms chest pain   8/10 - resolved with rest   Duration Progress to 45 minutes of aerobic exercise without signs/symptoms of physical distress   Intensity THRR unchanged     Progression   Progression Continue to progress workloads to maintain intensity without signs/symptoms of physical distress.   Average METs 2.48     Resistance Training   Training Prescription Yes   Weight 2   Reps 10-15     Interval Training   Interval Training No     Recumbant Bike   Level 1   RPM 60   Minutes 15     NuStep   Level 1   Minutes 15   METs 1.9      Nutrition:  Target Goals: Understanding of nutrition guidelines, daily intake of sodium <1557m, cholesterol <2039m calories 30% from fat and 7% or less from saturated fats, daily to have 5 or more servings of fruits and vegetables.  Biometrics:     Pre Biometrics - 07/05/16 1516      Pre Biometrics   Height 5' 3.5" (1.613 m)   Weight 180 lb 3.2 oz (81.7 kg)   Waist Circumference 43.75 inches   Hip Circumference 43 inches   Waist to Hip Ratio 1.02 %   BMI (Calculated) 31.5   Single Leg Stand 19.27 seconds       Nutrition Therapy Plan and Nutrition Goals:     Nutrition Therapy & Goals - 07/18/16 1132      Nutrition Therapy   Diet 1350 kcal Diabetes and heart healthy diet   Protein (specify units) 6   Fiber 20 grams   Whole Grain Foods 3 servings   Saturated Fats 11 max. grams    Fruits and Vegetables 8 servings/day   Sodium 1500 grams     Personal Nutrition Goals   Personal Goal #1 Control food portions by using small plates and eating generous portions of low-cal vegetables.    Comments patient reports feeling hungry almost constantly, and also states she has gastroparesis, but no issues with nausea or vomiting at this time. Advised small amounts of food often during the day, and limiting fiber intake.      Intervention Plan   Intervention Prescribe, educate and counsel regarding individualized specific dietary modifications aiming towards targeted core components such as weight, hypertension, lipid management, diabetes, heart failure and other comorbidities.;Nutrition handout(s)  given to patient.   Expected Outcomes Short Term Goal: Understand basic principles of dietary content, such as calories, fat, sodium, cholesterol and nutrients.;Short Term Goal: A plan has been developed with personal nutrition goals set during dietitian appointment.;Long Term Goal: Adherence to prescribed nutrition plan.      Nutrition Discharge: Rate Your Plate Scores:     Nutrition Assessments - 07/05/16 1545      Rate Your Plate Scores   Pre Score 66   Pre Score % 73 %      Nutrition Goals Re-Evaluation:   Psychosocial: Target Goals: Acknowledge presence or absence of depression, maximize coping skills, provide positive support system. Participant is able to verbalize types and ability to use techniques and skills needed for reducing stress and depression.  Initial Review & Psychosocial Screening:     Initial Psych Review & Screening - 07/05/16 1547      Initial Review   Current issues with Current Stress Concerns   Source of Stress Concerns Chronic Illness   Comments Kamara's husband is currently in the hospital at [facility] for Atrial Flutter- she reports.     Family Dynamics   Good Support System? Yes     Barriers   Psychosocial barriers to participate in program The  patient should benefit from training in stress management and relaxation.     Screening Interventions   Interventions Encouraged to exercise      Quality of Life Scores:     Quality of Life - 07/05/16 1645      Quality of Life Scores   Health/Function Pre 16.2 %   Socioeconomic Pre 21.79 %   Psych/Spiritual Pre 20.57 %   Family Pre 14.9 %   GLOBAL Pre 18.06 %      PHQ-9: Recent Review Flowsheet Data    Depression screen [facility] 2/9 07/05/2016   Decreased Interest 0   Down, Depressed, Hopeless 1   PHQ - 2 Score 1   Altered sleeping 1   Tired, decreased energy 3   Change in appetite 2   Feeling bad or failure about yourself  0   Trouble concentrating 1   Moving slowly or fidgety/restless 0   Suicidal thoughts 0   PHQ-9 Score 8      Psychosocial Evaluation and Intervention:     Psychosocial Evaluation - 08/02/16 1711      Psychosocial Evaluation & Interventions   Interventions Encouraged to exercise with the program and follow exercise prescription;Stress management education   Comments Counselor met with Ms. Grosshans today for initial psychosocial evaluation.  She is a 65 year old who had (3) stents inserted on 8/4.  She has a spouse of 91 years; a daughter who lives closeby and is also actively involved in her local church community.  Ms. Viona Gilmore reports multiple health issues in addition to her cardiac issues; she has asthma; diabetes; obesity; neuropathy and states she needs to have both knees replaced.  She reports not sleeping well due to the neuropathy and having a "horrible" appetite wherein she "binge eats" quite often.  Ms. Viona Gilmore states her Dr. recently recommended medications for her mood and she declined stating she's "tired of taking so many medications."  Counselor educated client on the impact of depression on her health; her sleep; and her appetite, which are all areas she is experiencing challenges.  Counselor and Ms. W agreed to give this program (2) weeks to see if  consistent exercise and trying an OTC natural sleep aid will help with her  sleep and her mood.  If not, Ms. Viona Gilmore agreed to consult with her doctor again about medications for her mood.  Ms. Viona Gilmore has goals to lose some weight and exercise consistently while in this program.  Counselor will be following with her throughout the course of this program.     Continued Psychosocial Services Needed Yes  Follow up with Ms. Viona Gilmore on her mood; sleep and appetite after two weeks in this program.  If no improvement; Ms. Viona Gilmore agreed to consult with her Dr. Donivan Scull: Rx for mood.        Psychosocial Re-Evaluation:     Psychosocial Re-Evaluation    [place] Name 07/19/16 1501             Psychosocial Re-Evaluation   Comments Devaney said she is sorry that she can't attend Cardiac Rehab lately since she has doctor's appts.  I told Angelisa about [facility] for activities since she said she is new to the area and it is hard for she and her husband to meet friends.           Vocational Rehabilitation: Provide vocational rehab assistance to qualifying candidates.   Vocational Rehab Evaluation & Intervention:     Vocational Rehab - 07/05/16 1547      Initial Vocational Rehab Evaluation & Intervention   Assessment shows need for Vocational Rehabilitation No      Education: Education Goals: Education classes will be provided on a weekly basis, covering required topics. Participant will state understanding/return demonstration of topics presented.  Learning Barriers/Preferences:     Learning Barriers/Preferences - 07/05/16 1540      Learning Barriers/Preferences   Learning Barriers None   Learning Preferences Individual Instruction      Education Topics: General Nutrition Guidelines/Fats and Fiber: -Group instruction provided by verbal, written material, models and posters to present the general guidelines for heart healthy nutrition. Gives an explanation and review of dietary fats and fiber. Flowsheet Row  Cardiac Rehab from 08/07/2016 in [facility] Cardiac and Pulmonary Rehab  Date  08/07/16  Educator  PI  Instruction Review Code  2- meets goals/outcomes      Controlling Sodium/Reading Food Labels: -Group verbal and written material supporting the discussion of sodium use in heart healthy nutrition. Review and explanation with models, verbal and written materials for utilization of the food label.   Exercise Physiology & Risk Factors: - Group verbal and written instruction with models to review the exercise physiology of the cardiovascular system and associated critical values. Details cardiovascular disease risk factors and the goals associated with each risk factor.   Aerobic Exercise & Resistance Training: - Gives group verbal and written discussion on the health impact of inactivity. On the components of aerobic and resistive training programs and the benefits of this training and how to safely progress through these programs.   Flexibility, Balance, General Exercise Guidelines: - Provides group verbal and written instruction on the benefits of flexibility and balance training programs. Provides general exercise guidelines with specific guidelines to those with heart or lung disease. Demonstration and skill practice provided.   Stress Management: - Provides group verbal and written instruction about the health risks of elevated stress, cause of high stress, and healthy ways to reduce stress.   Depression: - Provides group verbal and written instruction on the correlation between heart/lung disease and depressed mood, treatment options, and the stigmas associated with seeking treatment.   Anatomy & Physiology of the Heart: - Group verbal and written instruction and models provide  basic cardiac anatomy and physiology, with the coronary electrical and arterial systems. Review of: AMI, Angina, Valve disease, Heart Failure, Cardiac Arrhythmia, Pacemakers, and the ICD.   Cardiac  Procedures: - Group verbal and written instruction and models to describe the testing methods done to diagnose heart disease. Reviews the outcomes of the test results. Describes the treatment choices: Medical Management, Angioplasty, or Coronary Bypass Surgery. Flowsheet Row Cardiac Rehab from 08/07/2016 in [facility] Cardiac and Pulmonary Rehab  Date  07/17/16  Educator  [place]  Instruction Review Code  2- meets goals/outcomes      Cardiac Medications: - Group verbal and written instruction to review commonly prescribed medications for heart disease. Reviews the medication, class of the drug, and side effects. Includes the steps to properly store meds and maintain the prescription regimen. Flowsheet Row Cardiac Rehab from 08/07/2016 in [facility] Cardiac and Pulmonary Rehab  Date  07/26/16  Educator  [place]  Instruction Review Code  2- meets goals/outcomes      Go Sex-Intimacy & Heart Disease, Get SMART - Goal Setting: - Group verbal and written instruction through game format to discuss heart disease and the return to sexual intimacy. Provides group verbal and written material to discuss and apply goal setting through the application of the S.M.A.R.T. Method. Flowsheet Row Cardiac Rehab from 08/07/2016 in [facility] Cardiac and Pulmonary Rehab  Date  07/17/16  Educator  [place]  Instruction Review Code  2- meets goals/outcomes      Other Matters of the Heart: - Provides group verbal, written materials and models to describe Heart Failure, Angina, Valve Disease, and Diabetes in the realm of heart disease. Includes description of the disease process and treatment options available to the cardiac patient.   Exercise & Equipment Safety: - Individual verbal instruction and demonstration of equipment use and safety with use of the equipment. Flowsheet Row Cardiac Rehab from 08/07/2016 in [facility] Cardiac and Pulmonary Rehab  Date  07/05/16  Educator  C. EnterkinRN  Instruction Review Code  1- partially meets, needs  review/practice      Infection Prevention: - Provides verbal and written material to individual with discussion of infection control including proper hand washing and proper equipment cleaning during exercise session. Flowsheet Row Cardiac Rehab from 08/07/2016 in [facility] Cardiac and Pulmonary Rehab  Date  07/05/16  Educator  C. EnterkinRN  Instruction Review Code  2- meets goals/outcomes      Falls Prevention: - Provides verbal and written material to individual with discussion of falls prevention and safety. Flowsheet Row Cardiac Rehab from 08/07/2016 in [facility] Cardiac and Pulmonary Rehab  Date  07/05/16  Educator  C. [place]  Instruction Review Code  2- meets goals/outcomes      Diabetes: - Individual verbal and written instruction to review signs/symptoms of diabetes, desired ranges of glucose level fasting, after meals and with exercise. Advice that pre and post exercise glucose checks will be done for 3 sessions at entry of program. Flowsheet Row Cardiac Rehab from 08/07/2016 in [facility] Cardiac and Pulmonary Rehab  Date  07/05/16  Educator  Loletha Grayer ENterkinRN  Instruction Review Code  1- partially meets, needs review/practice       Knowledge Questionnaire Score:     Knowledge Questionnaire Score - 07/05/16 1540      Knowledge Questionnaire Score   Pre Score 20      Core Components/Risk Factors/Patient Goals at Admission:     Personal Goals and Risk Factors at Admission - 07/05/16 1545      Core Components/Risk  Factors/Patient Goals on Admission    Weight Management Yes   Intervention Weight Management: Develop a combined nutrition and exercise program designed to reach desired caloric intake, while maintaining appropriate intake of nutrient and fiber, sodium and fats, and appropriate energy expenditure required for the weight goal.;Weight Management: Provide education and appropriate resources to help participant work on and attain dietary goals.;Weight Management/Obesity:  Establish reasonable short term and long term weight goals.;Obesity: Provide education and appropriate resources to help participant work on and attain dietary goals.   Admit Weight 180 lb 3.2 oz (81.7 kg)   Goal Weight: Short Term 170 lb (77.1 kg)   Sedentary Yes   Intervention Provide advice, education, support and counseling about physical activity/exercise needs.;Develop an individualized exercise prescription for aerobic and resistive training based on initial evaluation findings, risk stratification, comorbidities and participant's personal goals.   Expected Outcomes Achievement of increased cardiorespiratory fitness and enhanced flexibility, muscular endurance and strength shown through measurements of functional capacity and personal statement of participant.   Increase Strength and Stamina Yes   Intervention Provide advice, education, support and counseling about physical activity/exercise needs.;Develop an individualized exercise prescription for aerobic and resistive training based on initial evaluation findings, risk stratification, comorbidities and participant's personal goals.   Expected Outcomes Achievement of increased cardiorespiratory fitness and enhanced flexibility, muscular endurance and strength shown through measurements of functional capacity and personal statement of participant.   Diabetes Yes   Intervention Provide education about signs/symptoms and action to take for hypo/hyperglycemia.;Provide education about proper nutrition, including hydration, and aerobic/resistive exercise prescription along with prescribed medications to achieve blood glucose in normal ranges: Fasting glucose 65-99 mg/dL   Expected Outcomes Short Term: Participant verbalizes understanding of the signs/symptoms and immediate care of hyper/hypoglycemia, proper foot care and importance of medication, aerobic/resistive exercise and nutrition plan for blood glucose control.;Long Term: Attainment of HbA1C <  7%.   Hypertension Yes   Intervention Provide education on lifestyle modifcations including regular physical activity/exercise, weight management, moderate sodium restriction and increased consumption of fresh fruit, vegetables, and low fat dairy, alcohol moderation, and smoking cessation.;Monitor prescription use compliance.   Expected Outcomes Short Term: Continued assessment and intervention until BP is < 140/50m HG in hypertensive participants. < 130/827mHG in hypertensive participants with diabetes, heart failure or chronic kidney disease.;Long Term: Maintenance of blood pressure at goal levels.   Lipids Yes   Intervention Provide education and support for participant on nutrition & aerobic/resistive exercise along with prescribed medications to achieve LDL <7056mHDL >50m73m Expected Outcomes Short Term: Participant states understanding of desired cholesterol values and is compliant with medications prescribed. Participant is following exercise prescription and nutrition guidelines.;Long Term: Cholesterol controlled with medications as prescribed, with individualized exercise RX and with personalized nutrition plan. Value goals: LDL < 70mg10mL > 40 mg.      Core Components/Risk Factors/Patient Goals Review:      Goals and Risk Factor Review    Row Name 07/19/16 1502 08/07/16 1818 08/10/16 1306         Core Components/Risk Factors/Patient Goals Review   Review NancyMea she is sorry that she can't attend Cardiac Rehab lately since she has doctor's appts.  8/10 chest pain while riding exercise recumbent bicycle. NancyLatayvia call her MD tomorrow since she is short of breath at times also. NancyEverettel has some heart blockage.  NancyIzora Gala MD Dr. CalveDarrin Nippered and she is short of breath. Monday Nuclear Stress test and will see CArdiologist on  Wednesday. I asked Domino to wait until she sees MD on Wed till when she will return to Cardiac Rehab.      Expected Outcomes Cont heart healthy  lifestyle. Free of chest pain.  Free of chest pain.        Core Components/Risk Factors/Patient Goals at Discharge (Final Review):      Goals and Risk Factor Review - 08/10/16 1306      Core Components/Risk Factors/Patient Goals Review   Review Izora Gala said MD Dr. Darrin Nipper called and she is short of breath. Monday Nuclear Stress test and will see CArdiologist on Wednesday. I asked Chidinma to wait until she sees MD on Wed till when she will return to Cardiac Rehab.    Expected Outcomes Free of chest pain.      ITP Comments:     ITP Comments    Row Name 07/19/16 1500 07/26/16 1823 07/27/16 1209 08/02/16 0717 08/02/16 1711   ITP Comments Izora Gala said she is sorry that she can't attend Cardiac Rehab lately since she has doctor's appts.  Required glucose tabs for low blood sugar post exercise. Shammara is progressing well with exercise. 30 day review. Continue with ITP unless changes noted by Medical Director at signature of review. Some chest discomfort while she was talking to the Ruth Counselor while she was working out on the recumbent bike. Once I had her stop the bicycle her chest discomfort went away. Shaylen reports tthe MD has her on a medicine holiday from her statins since she was soooo sore.    [place] Name 08/07/16 1816 08/10/16 1304 08/24/16 1546 08/30/16 1114     ITP Comments 8/10 chest pain while riding exercise recumbent bicycle. Jaydn will call her MD tomorrow since she is short of breath at times also. Shalan still has some heart blockage.  Izora Gala said MD Dr. Darrin Nipper called and she is short of breath. Monday Nuclear Stress test and will see CArdiologist on Wednesday. I asked Evani to wait until she sees MD on Wed till when she will return to Cardiac Rehab.  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 She said they gave her a long lasting NTg type medicine (Isosorbide) but she did not get  NTg sl. I told Roshawna that she can return on Monday and we are both aware that she will exercise till her chest pain returns then rest for a while and exercise some more until her chest pain returns. etc.  30 day review. Continue with ITP unless changes noted by Medical Director at signature of review. Remains absent       Comments:

## 2016-08-31 ENCOUNTER — Encounter

## 2016-09-04 ENCOUNTER — Encounter: Admitting: *Deleted

## 2016-09-04 DIAGNOSIS — Z955 Presence of coronary angioplasty implant and graft: Secondary | ICD-10-CM | POA: Diagnosis not present

## 2016-09-04 NOTE — Progress Notes (Signed)
Daily Session Note  Patient Details  Name: Suzanne Bernard MRN: [id number] Date of Birth: 1951-07-20 Referring Provider:   Flowsheet Row Cardiac Rehab from 07/05/2016 in [facility] Cardiac and Pulmonary Rehab  Referring Provider  Cavender      Encounter Date: 09/04/2016  Check In:     Session Check In - 09/04/16 1816      Check-In   Location [facility]-Cardiac & Pulmonary Rehab   Staff Present Heath Lark, RN, BSN, CCRP;Carroll Enterkin, RN, Moises Blood, BS, ACSM CEP, Exercise Physiologist   Supervising physician immediately available to respond to emergencies See telemetry face sheet for immediately available ER MD   Medication changes reported     Yes   Comments Isosorbide Mononitrate 30 mg 1x/day and Praluent 75 mg 1x/day   Fall or balance concerns reported    No   Warm-up and Cool-down Performed on first and last piece of equipment   Resistance Training Performed Yes   VAD Patient? No     Pain Assessment   Currently in Pain? No/denies   Multiple Pain Sites No         Goals Met:  Independence with exercise equipment Exercise tolerated well No report of cardiac concerns or symptoms Strength training completed today  Goals Unmet:  Not Applicable  Comments: Pt able to follow exercise prescription today without complaint.  Will continue to monitor for progression.    Dr. Emily Filbert is Medical Director for [place] and [facility].

## 2016-09-06 ENCOUNTER — Encounter: Admitting: *Deleted

## 2016-09-06 DIAGNOSIS — Z955 Presence of coronary angioplasty implant and graft: Secondary | ICD-10-CM

## 2016-09-06 NOTE — Progress Notes (Signed)
Daily Session Note  Patient Details  Name: Suzanne Bernard MRN: [id number] Date of Birth: December 28, 1950 Referring Provider:   Flowsheet Row Cardiac Rehab from 07/05/2016 in [facility] Cardiac and Pulmonary Rehab  Referring Provider  Cavender      Encounter Date: 09/06/2016  Check In:     Session Check In - 09/06/16 1631      Check-In   Staff Present Heath Lark, RN, BSN, CCRP;Carroll Enterkin, RN, Vickki Hearing, BA, ACSM CEP, Exercise Physiologist   Supervising physician immediately available to respond to emergencies See telemetry face sheet for immediately available ER MD   Medication changes reported     No   Fall or balance concerns reported    No   Warm-up and Cool-down Performed on first and last piece of equipment   Resistance Training Performed Yes   VAD Patient? No     Pain Assessment   Currently in Pain? No/denies         Goals Met:  Exercise tolerated well No report of cardiac concerns or symptoms  Goals Unmet:  Not Applicable  Comments: Doing well with exercise prescription progression.    Dr. Emily Filbert is Medical Director for [place] and [facility].

## 2016-09-07 ENCOUNTER — Encounter: Admitting: *Deleted

## 2016-09-07 DIAGNOSIS — Z955 Presence of coronary angioplasty implant and graft: Secondary | ICD-10-CM | POA: Diagnosis not present

## 2016-09-07 NOTE — Progress Notes (Signed)
Daily Session Note  Patient Details  Name: Suzanne Bernard MRN: [id number] Date of Birth: Apr 14, 1951 Referring Provider:   Flowsheet Row Cardiac Rehab from 07/05/2016 in [facility] Cardiac and Pulmonary Rehab  Referring Provider  Cavender      Encounter Date: 09/07/2016  Check In:     Session Check In - 09/07/16 1732      Check-In   Location [facility]-Cardiac & Pulmonary Rehab   Staff Present Gerlene Burdock, RN, Moises Blood, BS, ACSM CEP, Exercise Physiologist;Amanda Oletta Darter, [place], ACSM CEP, Exercise Physiologist   Supervising physician immediately available to respond to emergencies See telemetry face sheet for immediately available ER MD   Medication changes reported     No   Fall or balance concerns reported    No   Warm-up and Cool-down Performed on first and last piece of equipment   Resistance Training Performed Yes   VAD Patient? No     Pain Assessment   Currently in Pain? No/denies   Multiple Pain Sites No           Exercise Prescription Changes - 09/07/16 1300      Exercise Review   Progression Yes     Response to Exercise   Blood Pressure (Admit) 108/60   Blood Pressure (Exercise) 146/70   Blood Pressure (Exit) 120/60   Heart Rate (Admit) 62 bpm   Heart Rate (Exercise) 116 bpm   Heart Rate (Exit) 84 bpm   Rating of Perceived Exertion (Exercise) 14   Symptoms none   Duration Progress to 45 minutes of aerobic exercise without signs/symptoms of physical distress   Intensity THRR unchanged     Progression   Progression Continue to progress workloads to maintain intensity without signs/symptoms of physical distress.     Resistance Training   Training Prescription Yes   Weight 2   Reps 10-15     Interval Training   Interval Training No     NuStep   Level 1   Minutes 15      Goals Met:  Independence with exercise equipment Exercise tolerated well No report of cardiac concerns or symptoms Strength training completed today  Goals Unmet:  Not  Applicable  Comments: Pt able to follow exercise prescription today without complaint.  Will continue to monitor for progression.    Dr. Emily Filbert is Medical Director for [place] and [facility].

## 2016-09-08 ENCOUNTER — Ambulatory Visit
Admission: RE | Admit: 2016-09-08 | Discharge: 2016-09-08 | Disposition: A | Discharge status: Home / Self Care | Source: Ambulatory Visit | Attending: Geriatric Medicine | Admitting: Geriatric Medicine

## 2016-09-08 ENCOUNTER — Encounter: Payer: Self-pay | Admitting: Radiology

## 2016-09-08 DIAGNOSIS — Z1231 Encounter for screening mammogram for malignant neoplasm of breast: Secondary | ICD-10-CM | POA: Diagnosis present

## 2016-09-11 ENCOUNTER — Encounter: Admitting: *Deleted

## 2016-09-11 DIAGNOSIS — Z955 Presence of coronary angioplasty implant and graft: Secondary | ICD-10-CM

## 2016-09-11 NOTE — Progress Notes (Signed)
Daily Session Note  Patient Details  Name: Suzanne Bernard MRN: [id number] Date of Birth: 1951-02-26 Referring Provider:   Flowsheet Row Cardiac Rehab from 07/05/2016 in [facility] Cardiac and Pulmonary Rehab  Referring Provider  Cavender      Encounter Date: 09/11/2016  Check In:     Session Check In - 09/11/16 1801      Check-In   Location [facility]-Cardiac & Pulmonary Rehab   Staff Present Nyoka Cowden, RN, BSN, MA;Fayrene Towner, RN, Moises Blood, BS, ACSM CEP, Exercise Physiologist   Supervising physician immediately available to respond to emergencies See telemetry face sheet for immediately available ER MD   Medication changes reported     No   Fall or balance concerns reported    No   Warm-up and Cool-down Performed on first and last piece of equipment   Resistance Training Performed Yes   VAD Patient? No     Pain Assessment   Currently in Pain? No/denies         Goals Met:  Proper associated with RPD/PD & O2 Sat Exercise tolerated well  Goals Unmet:  Not Applicable  Comments:     Dr. Emily Filbert is Medical Director for [place] and [facility].

## 2016-09-13 ENCOUNTER — Encounter

## 2016-09-13 DIAGNOSIS — Z955 Presence of coronary angioplasty implant and graft: Secondary | ICD-10-CM | POA: Diagnosis not present

## 2016-09-13 NOTE — Progress Notes (Signed)
Daily Session Note  Patient Details  Name: Suzanne Bernard MRN: [id number] Date of Birth: 1951/05/22 Referring Provider:   Flowsheet Row Cardiac Rehab from 07/05/2016 in [facility] Cardiac and Pulmonary Rehab  Referring Provider  Cavender      Encounter Date: 09/13/2016  Check In:     Session Check In - 09/13/16 1755      Check-In   Location [facility]-Cardiac & Pulmonary Rehab   Staff Present Heath Lark, RN, BSN, CCRP;Carroll Enterkin, RN, Vickki Hearing, BA, ACSM CEP, Exercise Physiologist   Supervising physician immediately available to respond to emergencies See telemetry face sheet for immediately available ER MD   Medication changes reported     No   Fall or balance concerns reported    No   Warm-up and Cool-down Performed on first and last piece of equipment   Resistance Training Performed Yes   VAD Patient? No     Pain Assessment   Currently in Pain? No/denies   Multiple Pain Sites No         Goals Met:  Independence with exercise equipment Exercise tolerated well No report of cardiac concerns or symptoms Strength training completed today  Goals Unmet:  Not Applicable  Comments: Pt able to follow exercise prescription today without complaint.  Will continue to monitor for progression.    Dr. Emily Filbert is Medical Director for [place] and [facility].

## 2016-09-14 ENCOUNTER — Encounter: Admitting: *Deleted

## 2016-09-14 DIAGNOSIS — Z955 Presence of coronary angioplasty implant and graft: Secondary | ICD-10-CM

## 2016-09-14 NOTE — Progress Notes (Signed)
Daily Session Note  Patient Details  Name: Suzanne Bernard MRN: [id number] Date of Birth: Mar 19, 1951 Referring Provider:   Flowsheet Row Cardiac Rehab from 07/05/2016 in [facility] Cardiac and Pulmonary Rehab  Referring Provider  Cavender      Encounter Date: 09/14/2016  Check In:     Session Check In - 09/14/16 1704      Check-In   Location [facility]-Cardiac & Pulmonary Rehab   Staff Present Gerlene Burdock, RN, Moises Blood, BS, ACSM CEP, Exercise Physiologist;Amanda Oletta Darter, [place], ACSM CEP, Exercise Physiologist   Supervising physician immediately available to respond to emergencies See telemetry face sheet for immediately available ER MD   Medication changes reported     No   Fall or balance concerns reported    No   Warm-up and Cool-down Performed on first and last piece of equipment   Resistance Training Performed Yes   VAD Patient? No     Pain Assessment   Currently in Pain? No/denies   Multiple Pain Sites No         Goals Met:  Independence with exercise equipment Exercise tolerated well No report of cardiac concerns or symptoms Strength training completed today  Goals Unmet:  Not Applicable  Comments: Pt able to follow exercise prescription today without complaint.  Will continue to monitor for progression.    Dr. Emily Filbert is Medical Director for [place] and [facility].

## 2016-09-18 ENCOUNTER — Encounter: Admitting: *Deleted

## 2016-09-18 DIAGNOSIS — Z955 Presence of coronary angioplasty implant and graft: Secondary | ICD-10-CM

## 2016-09-18 NOTE — Progress Notes (Signed)
Daily Session Note  Patient Details  Name: Suzanne Bernard MRN: [id number] Date of Birth: 14-Dec-1950 Referring Provider:   Flowsheet Row Cardiac Rehab from 07/05/2016 in [facility] Cardiac and Pulmonary Rehab  Referring Provider  Cavender      Encounter Date: 09/18/2016  Check In:     Session Check In - 09/18/16 1729      Check-In   Staff Present Heath Lark, RN, BSN, CCRP;Carroll Enterkin, RN, Moises Blood, BS, ACSM CEP, Exercise Physiologist   Supervising physician immediately available to respond to emergencies See telemetry face sheet for immediately available ER MD   Medication changes reported     No   Fall or balance concerns reported    No   Warm-up and Cool-down Performed on first and last piece of equipment   Resistance Training Performed Yes   VAD Patient? No     Pain Assessment   Currently in Pain? No/denies         Goals Met:  Exercise tolerated well Personal goals reviewed No report of cardiac concerns or symptoms Strength training completed today  Goals Unmet:  Not Applicable  Comments: Doing well with exercise prescription progression.    Dr. Emily Filbert is Medical Director for [place] and [facility].

## 2016-09-20 ENCOUNTER — Encounter: Attending: Internal Medicine

## 2016-09-20 DIAGNOSIS — Z955 Presence of coronary angioplasty implant and graft: Secondary | ICD-10-CM | POA: Diagnosis not present

## 2016-09-20 NOTE — Progress Notes (Signed)
Daily Session Note  Patient Details  Name: Suzanne Bernard MRN: [id number] Date of Birth: 01/20/1951 Referring Provider:   Flowsheet Row Cardiac Rehab from 07/05/2016 in [facility] Cardiac and Pulmonary Rehab  Referring Provider  Cavender      Encounter Date: 09/20/2016  Check In:     Session Check In - 09/20/16 1755      Check-In   Location [facility]-Cardiac & Pulmonary Rehab   Staff Present Jeanell Sparrow, DPT, Burlene Arnt, BA, ACSM CEP, Exercise Physiologist;Other   Supervising physician immediately available to respond to emergencies See telemetry face sheet for immediately available ER MD   Medication changes reported     No   Fall or balance concerns reported    No   Warm-up and Cool-down Performed on first and last piece of equipment   Resistance Training Performed Yes   VAD Patient? No     Pain Assessment   Currently in Pain? No/denies   Multiple Pain Sites No           Exercise Prescription Changes - 09/20/16 1400      Exercise Review   Progression Yes     Response to Exercise   Blood Pressure (Admit) 120/70   Blood Pressure (Exercise) 142/70   Blood Pressure (Exit) 130/80   Heart Rate (Admit) 60 bpm   Heart Rate (Exercise) 107 bpm   Heart Rate (Exit) 87 bpm   Rating of Perceived Exertion (Exercise) 13   Symptoms none   Duration Progress to 45 minutes of aerobic exercise without signs/symptoms of physical distress   Intensity THRR unchanged     Progression   Progression Continue to progress workloads to maintain intensity without signs/symptoms of physical distress.   Average METs 2.58     Resistance Training   Training Prescription Yes   Weight 2   Reps 10-15     Interval Training   Interval Training No     Recumbant Bike   Level 1   RPM 60   Minutes 15   METs 3.06     Arm Ergometer   Level 1   Minutes 15   METs 2.1      Goals Met:  Independence with exercise equipment Exercise tolerated well No report of cardiac concerns or  symptoms Strength training completed today  Goals Unmet:  Not Applicable  Comments: Pt able to follow exercise prescription today without complaint.  Will continue to monitor for progression.    Dr. Emily Filbert is Medical Director for [place] and [facility].

## 2016-09-21 ENCOUNTER — Encounter: Admitting: *Deleted

## 2016-09-21 DIAGNOSIS — Z955 Presence of coronary angioplasty implant and graft: Secondary | ICD-10-CM

## 2016-09-21 NOTE — Progress Notes (Signed)
Daily Session Note  Patient Details  Name: Suzanne Bernard MRN: [id number] Date of Birth: 09/06/1951 Referring Provider:   Flowsheet Row Cardiac Rehab from 07/05/2016 in [facility] Cardiac and Pulmonary Rehab  Referring Provider  Cavender      Encounter Date: 09/21/2016  Check In:     Session Check In - 09/21/16 1716      Check-In   Location [facility]-Cardiac & Pulmonary Rehab   Staff Present Heath Lark, RN, BSN, Laveda Norman, BS, ACSM CEP, Exercise Physiologist;Amanda Oletta Darter, BA, ACSM CEP, Exercise Physiologist;Rebecca Brayton El, DPT, CEEA   Supervising physician immediately available to respond to emergencies See telemetry face sheet for immediately available ER MD   Medication changes reported     No   Fall or balance concerns reported    No   Warm-up and Cool-down Performed on first and last piece of equipment   Resistance Training Performed Yes   VAD Patient? No     Pain Assessment   Currently in Pain? No/denies   Multiple Pain Sites No         Goals Met:  Independence with exercise equipment Exercise tolerated well No report of cardiac concerns or symptoms Strength training completed today  Goals Unmet:  Not Applicable  Comments: Pt able to follow exercise prescription today without complaint.  Will continue to monitor for progression.    Dr. Emily Filbert is Medical Director for [place] and [facility].

## 2016-09-25 ENCOUNTER — Encounter: Admitting: *Deleted

## 2016-09-25 DIAGNOSIS — Z955 Presence of coronary angioplasty implant and graft: Secondary | ICD-10-CM | POA: Diagnosis not present

## 2016-09-25 NOTE — Progress Notes (Signed)
Daily Session Note  Patient Details  Name: Gale Klar MRN: [id number] Date of Birth: 1950/11/23 Referring Provider:   Flowsheet Row Cardiac Rehab from 07/05/2016 in [facility] Cardiac and Pulmonary Rehab  Referring Provider  Cavender      Encounter Date: 09/25/2016  Check In:     Session Check In - 09/25/16 1747      Check-In   Staff Present Gerlene Burdock, RN, BSN;[name], RN, BSN, Laveda Norman, BS, ACSM CEP, Exercise Physiologist   Supervising physician immediately available to respond to emergencies See telemetry face sheet for immediately available ER MD   Medication changes reported     No   Fall or balance concerns reported    No   Warm-up and Cool-down Performed on first and last piece of equipment   Resistance Training Performed Yes   VAD Patient? No     Pain Assessment   Currently in Pain? No/denies         Goals Met:  Exercise tolerated well No report of cardiac concerns or symptoms Strength training completed today  Goals Unmet:  Not Applicable  Comments: Doing well with exercise prescription progression.    Dr. Emily Filbert is Medical Director for [place] and [facility].

## 2016-09-27 ENCOUNTER — Encounter

## 2016-09-27 ENCOUNTER — Encounter: Payer: Self-pay | Admitting: *Deleted

## 2016-09-27 DIAGNOSIS — Z955 Presence of coronary angioplasty implant and graft: Secondary | ICD-10-CM

## 2016-09-27 NOTE — Progress Notes (Signed)
Daily Session Note  Patient Details  Name: Suzanne Bernard MRN: [id number] Date of Birth: 1951-01-11 Referring Provider:   Flowsheet Row Cardiac Rehab from 07/05/2016 in [facility] Cardiac and Pulmonary Rehab  Referring Provider  Cavender      Encounter Date: 09/27/2016  Check In:     Session Check In - 09/27/16 1644      Check-In   Location [facility]-Cardiac & Pulmonary Rehab   Staff Present Gerlene Burdock, RN, BSN;Nawal Burling, DPT, Burlene Arnt, BA, ACSM CEP, Exercise Physiologist   Supervising physician immediately available to respond to emergencies See telemetry face sheet for immediately available ER MD   Medication changes reported     No   Fall or balance concerns reported    No   Warm-up and Cool-down Performed on first and last piece of equipment   Resistance Training Performed Yes   VAD Patient? No     Pain Assessment   Currently in Pain? No/denies   Multiple Pain Sites No         Goals Met:  Independence with exercise equipment  Goals Unmet:  Not Applicable  Comments: Patient completed exercise prescription and all exercise goals during rehab session. The exercise was tolerated well and the patient is progressing in the program.    Dr. Emily Filbert is Medical Director for [place] and [facility].

## 2016-09-27 NOTE — Progress Notes (Signed)
Cardiac Individual Treatment Plan  Patient Details  Name: Suzanne Bernard MRN: [id number] Date of Birth: 1951/02/24 Referring Provider:   Flowsheet Row Cardiac Rehab from 07/05/2016 in [facility] Cardiac and Pulmonary Rehab  Referring Provider  Cavender      Initial Encounter Date:  Flowsheet Row Cardiac Rehab from 07/05/2016 in [facility] Cardiac and Pulmonary Rehab  Date  07/05/16  Referring Provider  Cavender      Visit Diagnosis: S/P coronary artery stent placement  Patient's Home Medications on Admission:  Current Outpatient Prescriptions:  .  allopurinol (ZYLOPRIM) 300 MG tablet, Take by mouth., Disp: , Rfl:  .  amLODipine (NORVASC) 10 MG tablet, Take by mouth., Disp: , Rfl:  .  carvedilol (COREG) 12.5 MG tablet, Take by mouth., Disp: , Rfl:  .  colestipol (COLESTID) 1 g tablet, TAKE 4 TABLETS DAILY, Disp: , Rfl:  .  gabapentin (NEURONTIN) 300 MG capsule, Take by mouth., Disp: , Rfl:  .  gemfibrozil (LOPID) 600 MG tablet, Take by mouth., Disp: , Rfl:  .  glimepiride (AMARYL) 4 MG tablet, Take by mouth 2 (two) times daily., Disp: , Rfl:  .  glucose blood (FREESTYLE LITE) test strip, , Disp: , Rfl:  .  Insulin Glargine (LANTUS SOLOSTAR) 100 UNIT/ML Solostar Pen, Inject into the skin., Disp: , Rfl:  .  isosorbide mononitrate (IMDUR) 30 MG 24 hr tablet, Take 30 mg by mouth., Disp: , Rfl:  .  losartan-hydrochlorothiazide (HYZAAR) 100-25 MG tablet, Take by mouth., Disp: , Rfl:  .  lubiprostone (AMITIZA) 24 MCG capsule, TAKE 1 CAPSULE TWICE A DAY WITH MEALS, Disp: , Rfl:  .  metFORMIN (GLUCOPHAGE) 1000 MG tablet, TAKE 1 TABLET TWICE A DAY, Disp: , Rfl:  .  montelukast (SINGULAIR) 10 MG tablet, Take by mouth., Disp: , Rfl:  .  ranitidine (ZANTAC) 150 MG capsule, Take by mouth., Disp: , Rfl:  .  sitaGLIPtin (JANUVIA) 100 MG tablet, once daily., Disp: , Rfl:   Past Medical History: No past medical history on file.  Tobacco Use: History  Smoking Status  . Not on file  Smokeless Tobacco   . Not on file    Labs: Recent Review Flowsheet Data    There is no flowsheet data to display.       Exercise Target Goals:    Exercise Program Goal: Individual exercise prescription set with THRR, safety & activity barriers. Participant demonstrates ability to understand and report RPE using BORG scale, to self-measure pulse accurately, and to acknowledge the importance of the exercise prescription.  Exercise Prescription Goal: Starting with aerobic activity 30 plus minutes a day, 3 days per week for initial exercise prescription. Provide home exercise prescription and guidelines that participant acknowledges understanding prior to discharge.  Activity Barriers & Risk Stratification:     Activity Barriers & Cardiac Risk Stratification - 07/05/16 1539      Activity Barriers & Cardiac Risk Stratification   Activity Barriers Back Problems;Deconditioning;Neck/Spine Problems;Shortness of Breath;Joint Problems      6 Minute Walk:     6 Minute Walk    Row Name 07/05/16 1516         6 Minute Walk   Distance 1333 feet     Walk Time 6 minutes     # of Rest Breaks 0     MPH 2.5     METS 2.94     RPE 8     VO2 Peak 10.3     Symptoms No     Resting HR  61 bpm     Resting BP 122/60     Max Ex. HR 90 bpm     Max Ex. BP 128/54        Initial Exercise Prescription:     Initial Exercise Prescription - 07/05/16 1500      Date of Initial Exercise RX and Referring Provider   Date 07/05/16   Referring Provider Cavender     Treadmill   MPH 2.5   Grade 0   Minutes 15   METs 2.91     Recumbant Bike   Level 2   RPM 60   Minutes 15   METs 2.98     NuStep   Level 3   Minutes 15   METs 3     Arm Ergometer   Level 1   Minutes 15   METs 2.9     REL-XR   Level 2   Minutes 15   METs 3     T5 Nustep   Level 2   Minutes 15   METs 3     Biostep-RELP   Level 3   Minutes 15   METs 3     Prescription Details   Frequency (times per week) 3   Duration  Progress to 45 minutes of aerobic exercise without signs/symptoms of physical distress     Intensity   THRR 40-80% of Max Heartrate 99-138   Ratings of Perceived Exertion 11-13   Perceived Dyspnea 0-4     Progression   Progression Continue to progress workloads to maintain intensity without signs/symptoms of physical distress.     Resistance Training   Training Prescription Yes   Weight 3   Reps 10-12      Perform Capillary Blood Glucose checks as needed.  Exercise Prescription Changes:     Exercise Prescription Changes    Row Name 07/27/16 1200 08/10/16 1100 09/07/16 1300 09/14/16 1700 09/20/16 1400     Exercise Review   Progression Yes Yes Yes  - Yes     Response to Exercise   Blood Pressure (Admit) 118/60 118/64 108/60  - 120/70   Blood Pressure (Exercise) 124/62 138/70 146/70  - 142/70   Blood Pressure (Exit) 126/64 130/62 120/60  - 130/80   Heart Rate (Admit) 53 bpm 70 bpm 62 bpm  - 60 bpm   Heart Rate (Exercise) 111 bpm 117 bpm 116 bpm  - 107 bpm   Heart Rate (Exit) 83 bpm 81 bpm 84 bpm  - 87 bpm   Rating of Perceived Exertion (Exercise) 15 15 14   - 13   Symptoms none chest pain   8/10 - resolved with rest none none none   Duration Progress to 45 minutes of aerobic exercise without signs/symptoms of physical distress Progress to 45 minutes of aerobic exercise without signs/symptoms of physical distress Progress to 45 minutes of aerobic exercise without signs/symptoms of physical distress Progress to 45 minutes of aerobic exercise without signs/symptoms of physical distress Progress to 45 minutes of aerobic exercise without signs/symptoms of physical distress   Intensity THRR unchanged THRR unchanged THRR unchanged THRR unchanged THRR unchanged     Progression   Progression Continue to progress workloads to maintain intensity without signs/symptoms of physical distress. Continue to progress workloads to maintain intensity without signs/symptoms of physical distress.  Continue to progress workloads to maintain intensity without signs/symptoms of physical distress. Continue to progress workloads to maintain intensity without signs/symptoms of physical distress. Continue to progress workloads to maintain intensity without signs/symptoms  of physical distress.   Average METs 2.2 2.48  -  - 2.58     Resistance Training   Training Prescription Yes Yes Yes Yes Yes   Weight 2 2 2 2 2    Reps 10-15 10-15 10-15 10-15 10-15     Interval Training   Interval Training No No No No No     Recumbant Bike   Level  - 1  -  - 1   RPM  - 60  -  - 60   Minutes  - 15  -  - 15   METs  -  -  -  - 3.06     NuStep   Level 1 1 1 1   -   Minutes 15 15 15 15   -   METs 2.4 1.9  -  -  -     Arm Ergometer   Level 1  -  -  - 1   Minutes 15  -  -  - 15   METs 2  -  -  - 2.1     Home Exercise Plan   Plans to continue exercise at  -  -  - Home  -   Frequency  -  -  - Add 2 additional days to program exercise sessions.  -      Exercise Comments:     Exercise Comments    Row Name 07/27/16 1208 08/10/16 1145 09/07/16 1353 09/14/16 1705 09/20/16 1425   Exercise Comments Suzanne Bernard is progressing well with exercise. Suzanne Bernard was recommended to follow up with her cardiologist re: chest pain.   Suzanne Bernard is doing well after returning to exercise. Home exercise plan reviewed with patient.  Suzanne Bernard is progressing well with exercise and we have discussed home exercise so she can add to her exercise here.      Discharge Exercise Prescription (Final Exercise Prescription Changes):     Exercise Prescription Changes - 09/20/16 1400      Exercise Review   Progression Yes     Response to Exercise   Blood Pressure (Admit) 120/70   Blood Pressure (Exercise) 142/70   Blood Pressure (Exit) 130/80   Heart Rate (Admit) 60 bpm   Heart Rate (Exercise) 107 bpm   Heart Rate (Exit) 87 bpm   Rating of Perceived Exertion (Exercise) 13   Symptoms none   Duration Progress to 45 minutes of aerobic  exercise without signs/symptoms of physical distress   Intensity THRR unchanged     Progression   Progression Continue to progress workloads to maintain intensity without signs/symptoms of physical distress.   Average METs 2.58     Resistance Training   Training Prescription Yes   Weight 2   Reps 10-15     Interval Training   Interval Training No     Recumbant Bike   Level 1   RPM 60   Minutes 15   METs 3.06     Arm Ergometer   Level 1   Minutes 15   METs 2.1      Nutrition:  Target Goals: Understanding of nutrition guidelines, daily intake of sodium <1565m, cholesterol <2042m calories 30% from fat and 7% or less from saturated fats, daily to have 5 or more servings of fruits and vegetables.  Biometrics:     Pre Biometrics - 07/05/16 1516      Pre Biometrics   Height 5' 3.5" (1.613 m)   Weight 180 lb 3.2 oz (81.7 kg)   Waist Circumference  43.75 inches   Hip Circumference 43 inches   Waist to Hip Ratio 1.02 %   BMI (Calculated) 31.5   Single Leg Stand 19.27 seconds       Nutrition Therapy Plan and Nutrition Goals:     Nutrition Therapy & Goals - 07/18/16 1132      Nutrition Therapy   Diet 1350 kcal Diabetes and heart healthy diet   Protein (specify units) 6   Fiber 20 grams   Whole Grain Foods 3 servings   Saturated Fats 11 max. grams   Fruits and Vegetables 8 servings/day   Sodium 1500 grams     Personal Nutrition Goals   Personal Goal #1 Control food portions by using small plates and eating generous portions of low-cal vegetables.    Comments patient reports feeling hungry almost constantly, and also states she has gastroparesis, but no issues with nausea or vomiting at this time. Advised small amounts of food often during the day, and limiting fiber intake.      Intervention Plan   Intervention Prescribe, educate and counsel regarding individualized specific dietary modifications aiming towards targeted core components such as weight,  hypertension, lipid management, diabetes, heart failure and other comorbidities.;Nutrition handout(s) given to patient.   Expected Outcomes Short Term Goal: Understand basic principles of dietary content, such as calories, fat, sodium, cholesterol and nutrients.;Short Term Goal: A plan has been developed with personal nutrition goals set during dietitian appointment.;Long Term Goal: Adherence to prescribed nutrition plan.      Nutrition Discharge: Rate Your Plate Scores:     Nutrition Assessments - 07/05/16 1545      Rate Your Plate Scores   Pre Score 66   Pre Score % 73 %      Nutrition Goals Re-Evaluation:     Nutrition Goals Re-Evaluation    Row Name 09/18/16 1701             Personal Goal #1 Re-Evaluation   Personal Goal #1 Control food portions by using small plates and eating generous portions of low-cal vegetables.        Goal Progress Seen Yes       Comments less snacking, states is happy  with this step.    Long term goal: Continue with steps towards healthy eating plan          Psychosocial: Target Goals: Acknowledge presence or absence of depression, maximize coping skills, provide positive support system. Participant is able to verbalize types and ability to use techniques and skills needed for reducing stress and depression.  Initial Review & Psychosocial Screening:     Initial Psych Review & Screening - 07/05/16 1547      Initial Review   Current issues with Current Stress Concerns   Source of Stress Concerns Chronic Illness   Comments Suzanne Bernard's husband is currently in the [facility] at [facility] for [employer]- she reports.     Family Dynamics   Good Support System? Yes     Barriers   Psychosocial barriers to participate in program The patient should benefit from training in stress management and relaxation.     Screening Interventions   Interventions Encouraged to exercise      Quality of Life Scores:     Quality of Life - 07/05/16 1645       Quality of Life Scores   Health/Function Pre 16.2 %   Socioeconomic Pre 21.79 %   Psych/Spiritual Pre 20.57 %   Family Pre 14.9 %   GLOBAL Pre 18.06 %  PHQ-9: Recent Review Flowsheet Data    Depression screen [facility] 2/9 07/05/2016   Decreased Interest 0   Down, Depressed, Hopeless 1   PHQ - 2 Score 1   Altered sleeping 1   Tired, decreased energy 3   Change in appetite 2   Feeling bad or failure about yourself  0   Trouble concentrating 1   Moving slowly or fidgety/restless 0   Suicidal thoughts 0   PHQ-9 Score 8      Psychosocial Evaluation and Intervention:     Psychosocial Evaluation - 08/02/16 1711      Psychosocial Evaluation & Interventions   Interventions Encouraged to exercise with the program and follow exercise prescription;Stress management education   Comments Counselor met with Suzanne Bernard today for initial psychosocial evaluation.  She is a 65 year old who had (3) stents inserted on 8/4.  She has a spouse of 17 years; a daughter who lives closeby and is also actively involved in her local church community.  Suzanne Bernard reports multiple health issues in addition to her cardiac issues; she has asthma; diabetes; obesity; neuropathy and states she needs to have both knees replaced.  She reports not sleeping well due to the neuropathy and having a "horrible" appetite wherein she "binge eats" quite often.  Suzanne Bernard states her Dr. recently recommended medications for her mood and she declined stating she's "tired of taking so many medications."  Counselor educated client on the impact of depression on her health; her sleep; and her appetite, which are all areas she is experiencing challenges.  Counselor and Suzanne Bernard agreed to give this program (2) weeks to see if consistent exercise and trying an OTC natural sleep aid will help with her sleep and her mood.  If not, Suzanne Bernard agreed to consult with her doctor again about medications for her mood.  Suzanne Bernard has goals to lose some weight and  exercise consistently while in this program.  Counselor will be following with her throughout the course of this program.     Continued Psychosocial Services Needed Yes  Follow up with Suzanne Bernard on her mood; sleep and appetite after two weeks in this program.  If no improvement; Suzanne Bernard agreed to consult with her Dr. Donivan Scull: Rx for mood.        Psychosocial Re-Evaluation:     Psychosocial Re-Evaluation    [place] Name 07/19/16 1501 09/20/16 1731           Psychosocial Re-Evaluation   Comments Suzanne Bernard Bernard she is sorry that she can't attend Cardiac Rehab lately since she has doctor's appts.  I told Suzanne Bernard about Suzanne Bernard for activities since she Bernard she is new to the area and it is hard for she and her husband to meet friends.  Counselor follow up with Suzanne Bernard today reporting she is sleeping better and feeling stronging now since coming into this program.  She realized taking her medications late at night were impacting her sleep; so she has stopped this.  She also discontinued the Celexa and is not taking natural Melatonin and continues to sleep well and have a positive mood.  She states her stress with being overweight has not changed; but she continues to try to eat healthier.  Counselor commended Suzanne Bernard for her progress made and her commitment to consistency in exercising.  Counselor offered encouragement and support in her commitment to healthier eating.           Vocational Rehabilitation: Provide vocational  rehab assistance to qualifying candidates.   Vocational Rehab Evaluation & Intervention:     Vocational Rehab - 07/05/16 1547      Initial Vocational Rehab Evaluation & Intervention   Assessment shows need for Vocational Rehabilitation No      Education: Education Goals: Education classes will be provided on a weekly basis, covering required topics. Participant will state understanding/return demonstration of topics presented.  Learning Barriers/Preferences:     Learning  Barriers/Preferences - 07/05/16 1540      Learning Barriers/Preferences   Learning Barriers None   Learning Preferences Individual Instruction      Education Topics: General Nutrition Guidelines/Fats and Fiber: -Group instruction provided by verbal, written material, models and posters to present the general guidelines for heart healthy nutrition. Gives an explanation and review of dietary fats and fiber. Flowsheet Row Cardiac Rehab from 09/25/2016 in [facility] Cardiac and Pulmonary Rehab  Date  08/07/16  Educator  PI  Instruction Review Code  2- meets goals/outcomes      Controlling Sodium/Reading Food Labels: -Group verbal and written material supporting the discussion of sodium use in heart healthy nutrition. Review and explanation with models, verbal and written materials for utilization of the food label.   Exercise Physiology & Risk Factors: - Group verbal and written instruction with models to review the exercise physiology of the cardiovascular system and associated critical values. Details cardiovascular disease risk factors and the goals associated with each risk factor.   Aerobic Exercise & Resistance Training: - Gives group verbal and written discussion on the health impact of inactivity. On the components of aerobic and resistive training programs and the benefits of this training and how to safely progress through these programs.   Flexibility, Balance, General Exercise Guidelines: - Provides group verbal and written instruction on the benefits of flexibility and balance training programs. Provides general exercise guidelines with specific guidelines to those with heart or lung disease. Demonstration and skill practice provided.   Stress Management: - Provides group verbal and written instruction about the health risks of elevated stress, cause of high stress, and healthy ways to reduce stress. Flowsheet Row Cardiac Rehab from 09/25/2016 in [facility] Cardiac and Pulmonary Rehab   Date  09/06/16  Educator  kc  Instruction Review Code  2- meets goals/outcomes      Depression: - Provides group verbal and written instruction on the correlation between heart/lung disease and depressed mood, treatment options, and the stigmas associated with seeking treatment.   Anatomy & Physiology of the Heart: - Group verbal and written instruction and models provide basic cardiac anatomy and physiology, with the coronary electrical and arterial systems. Review of: AMI, Angina, Valve disease, Heart Failure, Cardiac Arrhythmia, Pacemakers, and the ICD. Flowsheet Row Cardiac Rehab from 09/25/2016 in [facility] Cardiac and Pulmonary Rehab  Date  09/04/16  Educator  CE  Instruction Review Code  2- meets goals/outcomes      Cardiac Procedures: - Group verbal and written instruction and models to describe the testing methods done to diagnose heart disease. Reviews the outcomes of the test results. Describes the treatment choices: Medical Management, Angioplasty, or Coronary Bypass Surgery. Flowsheet Row Cardiac Rehab from 09/25/2016 in [facility] Cardiac and Pulmonary Rehab  Date  09/11/16  Educator  C. [place]  Instruction Review Code  2- meets goals/outcomes      Cardiac Medications: - Group verbal and written instruction to review commonly prescribed medications for heart disease. Reviews the medication, class of the drug, and side effects. Includes the steps to  properly store meds and maintain the prescription regimen. Flowsheet Row Cardiac Rehab from 09/25/2016 in [facility] Cardiac and Pulmonary Rehab  Date  09/18/16 Marisue Humble 1]  Educator  SB  Instruction Review Code  2- meets goals/outcomes      Go Sex-Intimacy & Heart Disease, Get SMART - Goal Setting: - Group verbal and written instruction through game format to discuss heart disease and the return to sexual intimacy. Provides group verbal and written material to discuss and apply goal setting through the application of the S.M.A.R.T.  Method. Flowsheet Row Cardiac Rehab from 09/25/2016 in [facility] Cardiac and Pulmonary Rehab  Date  09/11/16  Educator  C. EnterkinRN  Instruction Review Code  2- meets goals/outcomes      Other Matters of the Heart: - Provides group verbal, written materials and models to describe Heart Failure, Angina, Valve Disease, and Diabetes in the realm of heart disease. Includes description of the disease process and treatment options available to the cardiac patient. Flowsheet Row Cardiac Rehab from 09/25/2016 in [facility] Cardiac and Pulmonary Rehab  Date  09/04/16  Educator  CE  Instruction Review Code  2- meets goals/outcomes      Exercise & Equipment Safety: - Individual verbal instruction and demonstration of equipment use and safety with use of the equipment. Flowsheet Row Cardiac Rehab from 09/25/2016 in [facility] Cardiac and Pulmonary Rehab  Date  07/05/16  Educator  C. EnterkinRN  Instruction Review Code  1- partially meets, needs review/practice      Infection Prevention: - Provides verbal and written material to individual with discussion of infection control including proper hand washing and proper equipment cleaning during exercise session. Flowsheet Row Cardiac Rehab from 09/25/2016 in [place] [facility] Cardiac and Pulmonary Rehab  Date  07/05/16  Educator  C. EnterkinRN  Instruction Review Code  2- meets goals/outcomes      Falls Prevention: - Provides verbal and written material to individual with discussion of falls prevention and safety. Flowsheet Row Cardiac Rehab from 09/25/2016 in [facility] Cardiac and Pulmonary Rehab  Date  07/05/16  Educator  C. [place]  Instruction Review Code  2- meets goals/outcomes      Diabetes: - Individual verbal and written instruction to review signs/symptoms of diabetes, desired ranges of glucose level fasting, after meals and with exercise. Advice that pre and post exercise glucose checks will be done for 3 sessions at entry of program. Flowsheet Row Cardiac  Rehab from 09/25/2016 in [facility] Cardiac and Pulmonary Rehab  Date  07/05/16  Educator  Loletha Grayer ENterkinRN  Instruction Review Code  1- partially meets, needs review/practice       Knowledge Questionnaire Score:     Knowledge Questionnaire Score - 07/05/16 1540      Knowledge Questionnaire Score   Pre Score 20      Core Components/Risk Factors/Patient Goals at Admission:     Personal Goals and Risk Factors at Admission - 07/05/16 1545      Core Components/Risk Factors/Patient Goals on Admission    Weight Management Yes   Intervention Weight Management: Develop a combined nutrition and exercise program designed to reach desired caloric intake, while maintaining appropriate intake of nutrient and fiber, sodium and fats, and appropriate energy expenditure required for the weight goal.;Weight Management: Provide education and appropriate resources to help participant work on and attain dietary goals.;Weight Management/Obesity: Establish reasonable short term and long term weight goals.;Obesity: Provide education and appropriate resources to help participant work on and attain dietary goals.   Admit Weight 180 lb 3.2 oz (  81.7 kg)   Goal Weight: Short Term 170 lb (77.1 kg)   Sedentary Yes   Intervention Provide advice, education, support and counseling about physical activity/exercise needs.;Develop an individualized exercise prescription for aerobic and resistive training based on initial evaluation findings, risk stratification, comorbidities and participant's personal goals.   Expected Outcomes Achievement of increased cardiorespiratory fitness and enhanced flexibility, muscular endurance and strength shown through measurements of functional capacity and personal statement of participant.   Increase Strength and Stamina Yes   Intervention Provide advice, education, support and counseling about physical activity/exercise needs.;Develop an individualized exercise prescription for aerobic and  resistive training based on initial evaluation findings, risk stratification, comorbidities and participant's personal goals.   Expected Outcomes Achievement of increased cardiorespiratory fitness and enhanced flexibility, muscular endurance and strength shown through measurements of functional capacity and personal statement of participant.   Diabetes Yes   Intervention Provide education about signs/symptoms and action to take for hypo/hyperglycemia.;Provide education about proper nutrition, including hydration, and aerobic/resistive exercise prescription along with prescribed medications to achieve blood glucose in normal ranges: Fasting glucose 65-99 mg/dL   Expected Outcomes Short Term: Participant verbalizes understanding of the signs/symptoms and immediate care of hyper/hypoglycemia, proper foot care and importance of medication, aerobic/resistive exercise and nutrition plan for blood glucose control.;Long Term: Attainment of HbA1C < 7%.   Hypertension Yes   Intervention Provide education on lifestyle modifcations including regular physical activity/exercise, weight management, moderate sodium restriction and increased consumption of fresh fruit, vegetables, and low fat dairy, alcohol moderation, and smoking cessation.;Monitor prescription use compliance.   Expected Outcomes Short Term: Continued assessment and intervention until BP is < 140/107m HG in hypertensive participants. < 130/842mHG in hypertensive participants with diabetes, heart failure or chronic kidney disease.;Long Term: Maintenance of blood pressure at goal levels.   Lipids Yes   Intervention Provide education and support for participant on nutrition & aerobic/resistive exercise along with prescribed medications to achieve LDL <7031mHDL >49m62m Expected Outcomes Short Term: Participant states understanding of desired cholesterol values and is compliant with medications prescribed. Participant is following exercise prescription and  nutrition guidelines.;Long Term: Cholesterol controlled with medications as prescribed, with individualized exercise RX and with personalized nutrition plan. Value goals: LDL < 70mg17mL > 40 mg.      Core Components/Risk Factors/Patient Goals Review:      Goals and Risk Factor Review    Row Name 07/19/16 1502 08/07/16 1818 08/10/16 1306 09/18/16 1705 09/18/16 1725     Core Components/Risk Factors/Patient Goals Review   Personal Goals Review  -  -  - Weight Management/Obesity;Hypertension;Lipids;Diabetes  -   Review Suzanne Bernard she is sorry that she can't attend Cardiac Rehab lately since she has doctor's appts.  8/10 chest pain while riding exercise recumbent bicycle. Suzanne Bernard call her MD tomorrow since she is short of breath at times also. Suzanne Bernard has some heart blockage.  Suzanne Gala MD Dr. CalveDarrin Nippered and she is short of breath. Monday Nuclear Stress test and will see CArdiologist on Wednesday. I asked NancyLatoraait until she sees MD on Wed till when she will return to Cardiac Rehab.   - NanIzora Galarted her HbgA1c is down form 10 to 6.8% as of her MD call to her today. Clothes are loose even though no weight changes are noted.  She hasbeen exercising at home and doing well. BP remains stable and her cholesterol numbers are good per Suzanne Galaxpected Outcomes Cont heart healthy lifestyle. Free  of chest pain.  Free of chest pain.  - Continue working on good nutrtion habits to help with risk factor control. Continue to maintain symptom free with exercise      Core Components/Risk Factors/Patient Goals at Discharge (Final Review):      Goals and Risk Factor Review - 09/18/16 1725      Core Components/Risk Factors/Patient Goals Review   Review Suzanne Bernard reported her HbgA1c is down form 10 to 6.8% as of her MD call to her today. Clothes are loose even though no weight changes are noted.  She hasbeen exercising at home and doing well. BP remains stable and her cholesterol numbers are good per  Suzanne Bernard.   Expected Outcomes Continue working on good nutrtion habits to help with risk factor control. Continue to maintain symptom free with exercise      ITP Comments:     ITP Comments    Row Name 07/19/16 1500 07/26/16 1823 07/27/16 1209 08/02/16 0717 08/02/16 1711   ITP Comments Suzanne Bernard she is sorry that she can't attend Cardiac Rehab lately since she has doctor's appts.  Required glucose tabs for low blood sugar post exercise. Suzanne Bernard is progressing well with exercise. 30 day review. Continue with ITP unless changes noted by Medical Director at signature of review. Some chest discomfort while she was talking to the [place] Counselor while she was working out on the recumbent bike. Once I had her stop the bicycle her chest discomfort went away. Suzanne Bernard reports tthe MD has her on a medicine holiday from her statins since she was soooo sore.    [place] Name 08/07/16 1816 08/10/16 1304 08/24/16 1546 08/30/16 1114 09/27/16 0647   ITP Comments 8/10 chest pain while riding exercise recumbent bicycle. Korah will call her MD tomorrow since she is short of breath at times also. Dawnita still has some heart blockage.  Suzanne Bernard said MD Dr. Darrin Bernard called and she is short of breath. Monday Nuclear Stress test and will see CArdiologist on Wednesday. I asked Suzanne Bernard to wait until she sees MD on Wed till when she will return to Cardiac Rehab.  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 She Bernard they gave her a long lasting NTg type medicine (Isosorbide) but she did not get NTg sl. I told Quadasia that she can return on Monday and we are both aware that she will exercise till her chest pain returns then rest for a while and exercise some more until her chest pain returns. etc.  30 day review. Continue with ITP unless changes noted by Medical Director at signature of review. Remains absent 30 day review completed for Medical  Director physician review and signature. Continue ITP unless changes made by physician.      Comments:

## 2016-09-28 ENCOUNTER — Encounter

## 2016-10-02 ENCOUNTER — Encounter: Admitting: *Deleted

## 2016-10-02 DIAGNOSIS — Z955 Presence of coronary angioplasty implant and graft: Secondary | ICD-10-CM

## 2016-10-02 NOTE — Progress Notes (Signed)
Daily Session Note  Patient Details  Name: Suzanne Bernard MRN: [id number] Date of Birth: 05-28-1951 Referring Provider:   Flowsheet Row Cardiac Rehab from 07/05/2016 in [facility] Rehab  Referring Provider  Cavender      Encounter Date: 10/02/2016  Check In:     Session Check In - 10/02/16 1640      Check-In   Location [facility]-Cardiac & Pulmonary Rehab   Staff Present Gerlene Burdock, RN, BSN;[name], RN, BSN, Laveda Norman, BS, ACSM CEP, Exercise Physiologist   Supervising physician immediately available to respond to emergencies See telemetry face sheet for immediately available ER MD   Medication changes reported     No   Fall or balance concerns reported    No   Warm-up and Cool-down Performed on first and last piece of equipment   Resistance Training Performed Yes   VAD Patient? No     Pain Assessment   Currently in Pain? No/denies   Multiple Pain Sites No         Goals Met:  Independence with exercise equipment Exercise tolerated well No report of cardiac concerns or symptoms Strength training completed today  Goals Unmet:  Not Applicable  Comments: Pt able to follow exercise prescription today without complaint.  Will continue to monitor for progression.    Dr. Emily Filbert is Medical Director for [place] and [facility].

## 2016-10-04 ENCOUNTER — Telehealth: Payer: Self-pay

## 2016-10-04 ENCOUNTER — Encounter

## 2016-10-04 NOTE — Telephone Encounter (Signed)
Suzanne Bernard has a cold and plans to return to heart Track next week.

## 2016-10-05 ENCOUNTER — Encounter

## 2016-10-09 ENCOUNTER — Encounter

## 2016-10-11 ENCOUNTER — Encounter

## 2016-10-16 ENCOUNTER — Encounter: Admitting: *Deleted

## 2016-10-16 DIAGNOSIS — Z955 Presence of coronary angioplasty implant and graft: Secondary | ICD-10-CM | POA: Diagnosis not present

## 2016-10-16 NOTE — Progress Notes (Signed)
Daily Session Note  Patient Details  Name: Suzanne Bernard MRN: [id number] Date of Birth: 07-10-1951 Referring Provider:   Flowsheet Row Cardiac Rehab from 07/05/2016 in [facility] Cardiac and Pulmonary Rehab  Referring Provider  Cavender      Encounter Date: 10/16/2016  Check In:     Session Check In - 10/16/16 1710      Check-In   Location [facility]-Cardiac & Pulmonary Rehab   Staff Present Heath Lark, RN, BSN, CCRP;Dustina Scoggin, RN, Moises Blood, BS, ACSM CEP, Exercise Physiologist   Supervising physician immediately available to respond to emergencies See telemetry face sheet for immediately available ER MD   Medication changes reported     No   Fall or balance concerns reported    No   Warm-up and Cool-down Performed on first and last piece of equipment   Resistance Training Performed Yes   VAD Patient? No     Pain Assessment   Currently in Pain? No/denies         Goals Met:  Proper associated with RPD/PD & O2 Sat Exercise tolerated well  Goals Unmet:  Not Applicable  Comments:     Dr. Emily Filbert is Medical Director for [place] and [facility].

## 2016-10-18 ENCOUNTER — Encounter

## 2016-10-19 ENCOUNTER — Encounter

## 2016-10-19 DIAGNOSIS — Z955 Presence of coronary angioplasty implant and graft: Secondary | ICD-10-CM

## 2016-10-19 NOTE — Progress Notes (Signed)
Daily Session Note  Patient Details  Name: Gelisa Tieken MRN: [id number] Date of Birth: 1951-02-24 Referring Provider:   Flowsheet Row Cardiac Rehab from 07/05/2016 in [facility] Cardiac and Pulmonary Rehab  Referring Provider  Cavender      Encounter Date: 10/19/2016  Check In:     Session Check In - 10/19/16 1750      Check-In   Location [facility]-Cardiac & Pulmonary Rehab   Staff Present Earlean Shawl, BS, ACSM CEP, Exercise Physiologist;Eleri Ruben Oletta Darter, BA, ACSM CEP, Exercise Physiologist   Supervising physician immediately available to respond to emergencies See telemetry face sheet for immediately available ER MD   Medication changes reported     No   Fall or balance concerns reported    No   Warm-up and Cool-down Performed on first and last piece of equipment   Resistance Training Performed Yes   VAD Patient? No     Pain Assessment   Currently in Pain? No/denies   Multiple Pain Sites No           Exercise Prescription Changes - 10/19/16 1100      Exercise Review   Progression Yes     Response to Exercise   Blood Pressure (Admit) 132/82   Blood Pressure (Exercise) 146/72   Blood Pressure (Exit) 128/70   Heart Rate (Admit) 86 bpm   Heart Rate (Exercise) 113 bpm   Heart Rate (Exit) 86 bpm   Symptoms none   Duration Progress to 45 minutes of aerobic exercise without signs/symptoms of physical distress   Intensity THRR unchanged     Progression   Progression Continue to progress workloads to maintain intensity without signs/symptoms of physical distress.   Average METs 2.45     Resistance Training   Training Prescription Yes   Weight 3   Reps 10-15     Interval Training   Interval Training No     NuStep   Level 4   Minutes 15   METs 2.6     Arm Ergometer   Level 1   Minutes 15   METs 2.3      Goals Met:  Independence with exercise equipment Exercise tolerated well No report of cardiac concerns or symptoms Strength training completed today  Goals  Unmet:  Not Applicable  Comments: Pt able to follow exercise prescription today without complaint.  Will continue to monitor for progression.    Dr. Emily Filbert is Medical Director for [place] and [facility].

## 2016-10-23 ENCOUNTER — Encounter: Attending: Internal Medicine

## 2016-10-23 DIAGNOSIS — Z955 Presence of coronary angioplasty implant and graft: Secondary | ICD-10-CM | POA: Diagnosis not present

## 2016-10-23 NOTE — Progress Notes (Signed)
Daily Session Note  Patient Details  Name: Suzanne Bernard MRN: [id number] Date of Birth: 02-12-1951 Referring Provider:   Flowsheet Row Cardiac Rehab from 07/05/2016 in [facility] Cardiac and Pulmonary Rehab  Referring Provider  Cavender      Encounter Date: 10/23/2016  Check In:     Session Check In - 10/23/16 1810      Check-In   Location [facility]-Cardiac & Pulmonary Rehab   Staff Present Heath Lark, RN, BSN, CCRP;Rivan Siordia, DPT, Ronaldo Miyamoto, BS, ACSM CEP, Exercise Physiologist   Supervising physician immediately available to respond to emergencies See telemetry face sheet for immediately available ER MD   Medication changes reported     No   Fall or balance concerns reported    No   Warm-up and Cool-down Performed on first and last piece of equipment   Resistance Training Performed Yes   VAD Patient? No     Pain Assessment   Currently in Pain? No/denies   Multiple Pain Sites No         Goals Met:  Independence with exercise equipment  Goals Unmet:  Not Applicable  Comments: Patient completed exercise prescription and all exercise goals during rehab session. The exercise was tolerated well and the patient is progressing in the program.    Dr. Emily Filbert is Medical Director for [place] and [facility].

## 2016-10-25 ENCOUNTER — Encounter: Payer: Self-pay | Admitting: *Deleted

## 2016-10-25 ENCOUNTER — Encounter

## 2016-10-25 DIAGNOSIS — Z955 Presence of coronary angioplasty implant and graft: Secondary | ICD-10-CM

## 2016-10-25 NOTE — Progress Notes (Signed)
Cardiac Individual Treatment Plan  Patient Details  Name: Chanika Byland MRN: [id number] Date of Birth: [birth year] Referring Provider:   Flowsheet Row Cardiac Rehab from 07/05/2016 in [facility] Cardiac and Pulmonary Rehab  Referring Provider  Cavender      Initial Encounter Date:  Flowsheet Row Cardiac Rehab from 07/05/2016 in [facility] Cardiac and Pulmonary Rehab  Date  07/05/16  Referring Provider  Cavender      Visit Diagnosis: S/P coronary artery stent placement  Patient's Home Medications on Admission:  Current Outpatient Prescriptions:  .  allopurinol (ZYLOPRIM) 300 MG tablet, Take by mouth., Disp: , Rfl:  .  amLODipine (NORVASC) 10 MG tablet, Take by mouth., Disp: , Rfl:  .  carvedilol (COREG) 12.5 MG tablet, Take by mouth., Disp: , Rfl:  .  colestipol (COLESTID) 1 g tablet, TAKE 4 TABLETS DAILY, Disp: , Rfl:  .  gabapentin (NEURONTIN) 300 MG capsule, Take by mouth., Disp: , Rfl:  .  gemfibrozil (LOPID) 600 MG tablet, Take by mouth., Disp: , Rfl:  .  glimepiride (AMARYL) 4 MG tablet, Take by mouth 2 (two) times daily., Disp: , Rfl:  .  glucose blood (FREESTYLE LITE) test strip, , Disp: , Rfl:  .  Insulin Glargine (LANTUS SOLOSTAR) 100 UNIT/ML Solostar Pen, Inject into the skin., Disp: , Rfl:  .  isosorbide mononitrate (IMDUR) 30 MG 24 hr tablet, Take 30 mg by mouth., Disp: , Rfl:  .  losartan-hydrochlorothiazide (HYZAAR) 100-25 MG tablet, Take by mouth., Disp: , Rfl:  .  lubiprostone (AMITIZA) 24 MCG capsule, TAKE 1 CAPSULE TWICE A DAY WITH MEALS, Disp: , Rfl:  .  metFORMIN (GLUCOPHAGE) 1000 MG tablet, TAKE 1 TABLET TWICE A DAY, Disp: , Rfl:  .  montelukast (SINGULAIR) 10 MG tablet, Take by mouth., Disp: , Rfl:  .  ranitidine (ZANTAC) 150 MG capsule, Take by mouth., Disp: , Rfl:  .  sitaGLIPtin (JANUVIA) 100 MG tablet, once daily., Disp: , Rfl:   Past Medical History: No past medical history on file.  Tobacco Use: History  Smoking Status  . Not on file  Smokeless Tobacco   . Not on file    Labs: Recent Review Flowsheet Data    There is no flowsheet data to display.       Exercise Target Goals:    Exercise Program Goal: Individual exercise prescription set with THRR, safety & activity barriers. Participant demonstrates ability to understand and report RPE using BORG scale, to self-measure pulse accurately, and to acknowledge the importance of the exercise prescription.  Exercise Prescription Goal: Starting with aerobic activity 30 plus minutes a day, 3 days per week for initial exercise prescription. Provide home exercise prescription and guidelines that participant acknowledges understanding prior to discharge.  Activity Barriers & Risk Stratification:     Activity Barriers & Cardiac Risk Stratification - 07/05/16 1539      Activity Barriers & Cardiac Risk Stratification   Activity Barriers Back Problems;Deconditioning;Neck/Spine Problems;Shortness of Breath;Joint Problems      6 Minute Walk:     6 Minute Walk    Row Name 07/05/16 1516         6 Minute Walk   Distance 1333 feet     Walk Time 6 minutes     # of Rest Breaks 0     MPH 2.5     METS 2.94     RPE 8     VO2 Peak 10.3     Symptoms No     Resting HR  61 bpm     Resting BP 122/60     Max Ex. HR 90 bpm     Max Ex. BP 128/54        Initial Exercise Prescription:     Initial Exercise Prescription - 07/05/16 1500      Date of Initial Exercise RX and Referring Provider   Date 07/05/16   Referring Provider Cavender     Treadmill   MPH 2.5   Grade 0   Minutes 15   METs 2.91     Recumbant Bike   Level 2   RPM 60   Minutes 15   METs 2.98     NuStep   Level 3   Minutes 15   METs 3     Arm Ergometer   Level 1   Minutes 15   METs 2.9     REL-XR   Level 2   Minutes 15   METs 3     T5 Nustep   Level 2   Minutes 15   METs 3     Biostep-RELP   Level 3   Minutes 15   METs 3     Prescription Details   Frequency (times per week) 3   Duration  Progress to 45 minutes of aerobic exercise without signs/symptoms of physical distress     Intensity   THRR 40-80% of Max Heartrate 99-138   Ratings of Perceived Exertion 11-13   Perceived Dyspnea 0-4     Progression   Progression Continue to progress workloads to maintain intensity without signs/symptoms of physical distress.     Resistance Training   Training Prescription Yes   Weight 3   Reps 10-12      Perform Capillary Blood Glucose checks as needed.  Exercise Prescription Changes:     Exercise Prescription Changes    Row Name 07/27/16 1200 08/10/16 1100 09/07/16 1300 09/14/16 1700 09/20/16 1400     Exercise Review   Progression Yes Yes Yes  - Yes     Response to Exercise   Blood Pressure (Admit) 118/60 118/64 108/60  - 120/70   Blood Pressure (Exercise) 124/62 138/70 146/70  - 142/70   Blood Pressure (Exit) 126/64 130/62 120/60  - 130/80   Heart Rate (Admit) 53 bpm 70 bpm 62 bpm  - 60 bpm   Heart Rate (Exercise) 111 bpm 117 bpm 116 bpm  - 107 bpm   Heart Rate (Exit) 83 bpm 81 bpm 84 bpm  - 87 bpm   Rating of Perceived Exertion (Exercise) 15 15 14   - 13   Symptoms none chest pain   8/10 - resolved with rest none none none   Duration Progress to 45 minutes of aerobic exercise without signs/symptoms of physical distress Progress to 45 minutes of aerobic exercise without signs/symptoms of physical distress Progress to 45 minutes of aerobic exercise without signs/symptoms of physical distress Progress to 45 minutes of aerobic exercise without signs/symptoms of physical distress Progress to 45 minutes of aerobic exercise without signs/symptoms of physical distress   Intensity THRR unchanged THRR unchanged THRR unchanged THRR unchanged THRR unchanged     Progression   Progression Continue to progress workloads to maintain intensity without signs/symptoms of physical distress. Continue to progress workloads to maintain intensity without signs/symptoms of physical distress.  Continue to progress workloads to maintain intensity without signs/symptoms of physical distress. Continue to progress workloads to maintain intensity without signs/symptoms of physical distress. Continue to progress workloads to maintain intensity without signs/symptoms  of physical distress.   Average METs 2.2 2.48  -  - 2.58     Resistance Training   Training Prescription Yes Yes Yes Yes Yes   Weight 2 2 2 2 2    Reps 10-15 10-15 10-15 10-15 10-15     Interval Training   Interval Training No No No No No     Recumbant Bike   Level  - 1  -  - 1   RPM  - 60  -  - 60   Minutes  - 15  -  - 15   METs  -  -  -  - 3.06     NuStep   Level 1 1 1 1   -   Minutes 15 15 15 15   -   METs 2.4 1.9  -  -  -     Arm Ergometer   Level 1  -  -  - 1   Minutes 15  -  -  - 15   METs 2  -  -  - 2.1     Home Exercise Plan   Plans to continue exercise at  -  -  - Home  -   Frequency  -  -  - Add 2 additional days to program exercise sessions.  -   Row Name 10/06/16 0800 10/19/16 1100           Exercise Review   Progression Yes Yes        Response to Exercise   Blood Pressure (Admit) 122/74 132/82      Blood Pressure (Exercise) 132/70 146/72      Blood Pressure (Exit) 128/60 128/70      Heart Rate (Admit) 78 bpm 86 bpm      Heart Rate (Exercise) 100 bpm 113 bpm      Heart Rate (Exit) 77 bpm 86 bpm      Rating of Perceived Exertion (Exercise) 13  -      Symptoms none none      Duration Progress to 45 minutes of aerobic exercise without signs/symptoms of physical distress Progress to 45 minutes of aerobic exercise without signs/symptoms of physical distress      Intensity THRR unchanged THRR unchanged        Progression   Progression Continue to progress workloads to maintain intensity without signs/symptoms of physical distress. Continue to progress workloads to maintain intensity without signs/symptoms of physical distress.      Average METs 2 2.45        Resistance Training   Training  Prescription Yes Yes      Weight 3 3      Reps 10-15 10-15        Interval Training   Interval Training No No        NuStep   Level 6 4      Minutes 15 15      METs 2 2.6        Arm Ergometer   Level 1 1      Minutes 15 15      METs 2.1 2.3         Exercise Comments:     Exercise Comments    Row Name 07/27/16 1208 08/10/16 1145 09/07/16 1353 09/14/16 1705 09/20/16 1425   Exercise Comments Darian is progressing well with exercise. Jakeria was recommended to follow up with her cardiologist re: chest pain.   Jozlin is doing well after returning to exercise. Home exercise  plan reviewed with patient.  Terrisha is progressing well with exercise and we have discussed home exercise so she can add to her exercise here.   Row Name 10/06/16 6256 10/19/16 1152         Exercise Comments Fredonia is progressing well and has moved up her weight strength training. Akyah continues to progress well with exercise.         Discharge Exercise Prescription (Final Exercise Prescription Changes):     Exercise Prescription Changes - 10/19/16 1100      Exercise Review   Progression Yes     Response to Exercise   Blood Pressure (Admit) 132/82   Blood Pressure (Exercise) 146/72   Blood Pressure (Exit) 128/70   Heart Rate (Admit) 86 bpm   Heart Rate (Exercise) 113 bpm   Heart Rate (Exit) 86 bpm   Symptoms none   Duration Progress to 45 minutes of aerobic exercise without signs/symptoms of physical distress   Intensity THRR unchanged     Progression   Progression Continue to progress workloads to maintain intensity without signs/symptoms of physical distress.   Average METs 2.45     Resistance Training   Training Prescription Yes   Weight 3   Reps 10-15     Interval Training   Interval Training No     NuStep   Level 4   Minutes 15   METs 2.6     Arm Ergometer   Level 1   Minutes 15   METs 2.3      Nutrition:  Target Goals: Understanding of nutrition guidelines, daily intake of  sodium <1513m, cholesterol <2049m calories 30% from fat and 7% or less from saturated fats, daily to have 5 or more servings of fruits and vegetables.  Biometrics:     Pre Biometrics - 07/05/16 1516      Pre Biometrics   Height 5' 3.5" (1.613 m)   Weight 180 lb 3.2 oz (81.7 kg)   Waist Circumference 43.75 inches   Hip Circumference 43 inches   Waist to Hip Ratio 1.02 %   BMI (Calculated) 31.5   Single Leg Stand 19.27 seconds       Nutrition Therapy Plan and Nutrition Goals:     Nutrition Therapy & Goals - 07/18/16 1132      Nutrition Therapy   Diet 1350 kcal Diabetes and heart healthy diet   Protein (specify units) 6   Fiber 20 grams   Whole Grain Foods 3 servings   Saturated Fats 11 max. grams   Fruits and Vegetables 8 servings/day   Sodium 1500 grams     Personal Nutrition Goals   Personal Goal #1 Control food portions by using small plates and eating generous portions of low-cal vegetables.    Comments patient reports feeling hungry almost constantly, and also states she has gastroparesis, but no issues with nausea or vomiting at this time. Advised small amounts of food often during the day, and limiting fiber intake.      Intervention Plan   Intervention Prescribe, educate and counsel regarding individualized specific dietary modifications aiming towards targeted core components such as weight, hypertension, lipid management, diabetes, heart failure and other comorbidities.;Nutrition handout(s) given to patient.   Expected Outcomes Short Term Goal: Understand basic principles of dietary content, such as calories, fat, sodium, cholesterol and nutrients.;Short Term Goal: A plan has been developed with personal nutrition goals set during dietitian appointment.;Long Term Goal: Adherence to prescribed nutrition plan.      Nutrition Discharge: Rate  Your Plate Scores:     Nutrition Assessments - 10/20/16 1002      Rate Your Plate Scores   Pre Score 66   Pre Score % 73  %   Post Score 80   Post Score % 88.9 %   % Change 15.9 %      Nutrition Goals Re-Evaluation:     Nutrition Goals Re-Evaluation    Row Name 09/18/16 1701             Personal Goal #1 Re-Evaluation   Personal Goal #1 Control food portions by using small plates and eating generous portions of low-cal vegetables.        Goal Progress Seen Yes       Comments less snacking, states is happy  with this step.    Long term goal: Continue with steps towards healthy eating plan          Psychosocial: Target Goals: Acknowledge presence or absence of depression, maximize coping skills, provide positive support system. Participant is able to verbalize types and ability to use techniques and skills needed for reducing stress and depression.  Initial Review & Psychosocial Screening:     Initial Psych Review & Screening - 07/05/16 1547      Initial Review   Current issues with Current Stress Concerns   Source of Stress Concerns Chronic Illness   Comments Alajiah's husband is currently in the hospital at [facility] for Atrial Flutter- she reports.     Family Dynamics   Good Support System? Yes     Barriers   Psychosocial barriers to participate in program The patient should benefit from training in stress management and relaxation.     Screening Interventions   Interventions Encouraged to exercise      Quality of Life Scores:     Quality of Life - 10/20/16 1002      Quality of Life Scores   Health/Function Pre 16.2 %   Health/Function Post 21.61 %   Health/Function % Change 33.4 %   Socioeconomic Pre 21.79 %   Socioeconomic Post 25.92 %   Socioeconomic % Change  18.95 %   Psych/Spiritual Pre 20.57 %   Psych/Spiritual Post 26.21 %   Psych/Spiritual % Change 27.42 %   Family Pre 14.9 %   Family Post 28.8 %   Family % Change 93.29 %   GLOBAL Pre 18.06 %   GLOBAL Post 24.55 %   GLOBAL % Change 35.94 %      PHQ-9: Recent Review Flowsheet Data    Depression screen [facility] 2/9  10/20/2016 07/05/2016   Decreased Interest 2 0   Down, Depressed, Hopeless 0 1   PHQ - 2 Score 2 1   Altered sleeping 2 1   Tired, decreased energy 1 3   Change in appetite 0 2   Feeling bad or failure about yourself  0 0   Trouble concentrating 0 1   Moving slowly or fidgety/restless 0 0   Suicidal thoughts 0 0   PHQ-9 Score 5 8   Difficult doing work/chores Not difficult at all -      Psychosocial Evaluation and Intervention:     Psychosocial Evaluation - 10/23/16 1734      Discharge Psychosocial Assessment & Intervention   Comments Counselor met with Ms. W today for discharge evaluation.  She reports this program has been "wonderful" for her and her energy level has increased significantly since she began coming.  She is sleeping better and reports the  injections to reduce her cholesterol levels have been effective and that is encouraging as well.  Ms. Viona Gilmore plans to continue exercising beyond this program either in [employer] or joining a gym.  She also has purchased a recumbent bike but "hates riding it at home!"  Counselor commended Ms. W for her consistency in exercising and for the progress she has made on many levels.         Psychosocial Re-Evaluation:     Psychosocial Re-Evaluation    [place] Name 07/19/16 1501 09/20/16 1731           Psychosocial Re-Evaluation   Comments Vergene said she is sorry that she can't attend Cardiac Rehab lately since she has doctor's appts.  I told Saxon about [facility] for activities since she said she is new to the area and it is hard for she and her husband to meet friends.  Counselor follow up with Ms. W today reporting she is sleeping better and feeling stronging now since coming into this program.  She realized taking her medications late at night were impacting her sleep; so she has stopped this.  She also discontinued the Celexa and is not taking natural Melatonin and continues to sleep well and have a positive mood.  She states her  stress with being overweight has not changed; but she continues to try to eat healthier.  Counselor commended Ms. W for her progress made and her commitment to consistency in exercising.  Counselor offered encouragement and support in her commitment to healthier eating.           Vocational Rehabilitation: Provide vocational rehab assistance to qualifying candidates.   Vocational Rehab Evaluation & Intervention:     Vocational Rehab - 07/05/16 1547      Initial Vocational Rehab Evaluation & Intervention   Assessment shows need for Vocational Rehabilitation No      Education: Education Goals: Education classes will be provided on a weekly basis, covering required topics. Participant will state understanding/return demonstration of topics presented.  Learning Barriers/Preferences:     Learning Barriers/Preferences - 07/05/16 1540      Learning Barriers/Preferences   Learning Barriers None   Learning Preferences Individual Instruction      Education Topics: General Nutrition Guidelines/Fats and Fiber: -Group instruction provided by verbal, written material, models and posters to present the general guidelines for heart healthy nutrition. Gives an explanation and review of dietary fats and fiber. Flowsheet Row Cardiac Rehab from 10/23/2016 in [facility] Cardiac and Pulmonary Rehab  Date  10/02/16  Educator  SB  Instruction Review Code  2- meets goals/outcomes      Controlling Sodium/Reading Food Labels: -Group verbal and written material supporting the discussion of sodium use in heart healthy nutrition. Review and explanation with models, verbal and written materials for utilization of the food label.   Exercise Physiology & Risk Factors: - Group verbal and written instruction with models to review the exercise physiology of the cardiovascular system and associated critical values. Details cardiovascular disease risk factors and the goals associated with each risk  factor. Flowsheet Row Cardiac Rehab from 10/23/2016 in [facility] Cardiac and Pulmonary Rehab  Date  09/27/16  Educator  AS  Instruction Review Code  2- meets goals/outcomes      Aerobic Exercise & Resistance Training: - Gives group verbal and written discussion on the health impact of inactivity. On the components of aerobic and resistive training programs and the benefits of this training and how to safely progress through these  programs. Flowsheet Row Cardiac Rehab from 10/23/2016 in [facility] Cardiac and Pulmonary Rehab  Date  10/16/16  Educator  K. Amedeo Plenty  Instruction Review Code  2- meets goals/outcomes      Flexibility, Balance, General Exercise Guidelines: - Provides group verbal and written instruction on the benefits of flexibility and balance training programs. Provides general exercise guidelines with specific guidelines to those with heart or lung disease. Demonstration and skill practice provided.   Stress Management: - Provides group verbal and written instruction about the health risks of elevated stress, cause of high stress, and healthy ways to reduce stress. Flowsheet Row Cardiac Rehab from 10/23/2016 in [facility] Cardiac and Pulmonary Rehab  Date  09/06/16  Educator  kc  Instruction Review Code  2- meets goals/outcomes      Depression: - Provides group verbal and written instruction on the correlation between heart/lung disease and depressed mood, treatment options, and the stigmas associated with seeking treatment.   Anatomy & Physiology of the Heart: - Group verbal and written instruction and models provide basic cardiac anatomy and physiology, with the coronary electrical and arterial systems. Review of: AMI, Angina, Valve disease, Heart Failure, Cardiac Arrhythmia, Pacemakers, and the ICD. Flowsheet Row Cardiac Rehab from 10/23/2016 in [facility] Cardiac and Pulmonary Rehab  Date  10/23/16  Educator  SB  Instruction Review Code  2- meets goals/outcomes      Cardiac  Procedures: - Group verbal and written instruction and models to describe the testing methods done to diagnose heart disease. Reviews the outcomes of the test results. Describes the treatment choices: Medical Management, Angioplasty, or Coronary Bypass Surgery. Flowsheet Row Cardiac Rehab from 10/23/2016 in [facility] Cardiac and Pulmonary Rehab  Date  09/11/16  Educator  C. [place]  Instruction Review Code  2- meets goals/outcomes      Cardiac Medications: - Group verbal and written instruction to review commonly prescribed medications for heart disease. Reviews the medication, class of the drug, and side effects. Includes the steps to properly store meds and maintain the prescription regimen. Flowsheet Row Cardiac Rehab from 10/23/2016 in [facility] Cardiac and Pulmonary Rehab  Date  09/18/16 Marisue Humble 1]  Educator  SB  Instruction Review Code  2- meets goals/outcomes      Go Sex-Intimacy & Heart Disease, Get SMART - Goal Setting: - Group verbal and written instruction through game format to discuss heart disease and the return to sexual intimacy. Provides group verbal and written material to discuss and apply goal setting through the application of the S.M.A.R.T. Method. Flowsheet Row Cardiac Rehab from 10/23/2016 in [facility] Cardiac and Pulmonary Rehab  Date  09/11/16  Educator  C. EnterkinRN  Instruction Review Code  2- meets goals/outcomes      Other Matters of the Heart: - Provides group verbal, written materials and models to describe Heart Failure, Angina, Valve Disease, and Diabetes in the realm of heart disease. Includes description of the disease process and treatment options available to the cardiac patient. Flowsheet Row Cardiac Rehab from 10/23/2016 in [facility] Cardiac and Pulmonary Rehab  Date  10/23/16  Educator  SB  Instruction Review Code  2- meets goals/outcomes      Exercise & Equipment Safety: - Individual verbal instruction and demonstration of equipment use and safety with use of  the equipment. Flowsheet Row Cardiac Rehab from 10/23/2016 in [facility] Cardiac and Pulmonary Rehab  Date  07/05/16  Educator  C. Gene Autry  Instruction Review Code  1- partially meets, needs review/practice      Infection Prevention: -  Provides verbal and written material to individual with discussion of infection control including proper hand washing and proper equipment cleaning during exercise session. Flowsheet Row Cardiac Rehab from 10/23/2016 in [facility] Cardiac and Pulmonary Rehab  Date  07/05/16  Educator  C. EnterkinRN  Instruction Review Code  2- meets goals/outcomes      Falls Prevention: - Provides verbal and written material to individual with discussion of falls prevention and safety. Flowsheet Row Cardiac Rehab from 10/23/2016 in [facility] Cardiac and Pulmonary Rehab  Date  07/05/16  Educator  C. [place]  Instruction Review Code  2- meets goals/outcomes      Diabetes: - Individual verbal and written instruction to review signs/symptoms of diabetes, desired ranges of glucose level fasting, after meals and with exercise. Advice that pre and post exercise glucose checks will be done for 3 sessions at entry of program. Flowsheet Row Cardiac Rehab from 10/23/2016 in [facility] Cardiac and Pulmonary Rehab  Date  07/05/16  Educator  Loletha Grayer ENterkinRN  Instruction Review Code  1- partially meets, needs review/practice       Knowledge Questionnaire Score:     Knowledge Questionnaire Score - 10/20/16 1002      Knowledge Questionnaire Score   Pre Score 20/28   Post Score 23/28      Core Components/Risk Factors/Patient Goals at Admission:     Personal Goals and Risk Factors at Admission - 07/05/16 1545      Core Components/Risk Factors/Patient Goals on Admission    Weight Management Yes   Intervention Weight Management: Develop a combined nutrition and exercise program designed to reach desired caloric intake, while maintaining appropriate intake of nutrient and fiber, sodium and  fats, and appropriate energy expenditure required for the weight goal.;Weight Management: Provide education and appropriate resources to help participant work on and attain dietary goals.;Weight Management/Obesity: Establish reasonable short term and long term weight goals.;Obesity: Provide education and appropriate resources to help participant work on and attain dietary goals.   Admit Weight 180 lb 3.2 oz (81.7 kg)   Goal Weight: Short Term 170 lb (77.1 kg)   Sedentary Yes   Intervention Provide advice, education, support and counseling about physical activity/exercise needs.;Develop an individualized exercise prescription for aerobic and resistive training based on initial evaluation findings, risk stratification, comorbidities and participant's personal goals.   Expected Outcomes Achievement of increased cardiorespiratory fitness and enhanced flexibility, muscular endurance and strength shown through measurements of functional capacity and personal statement of participant.   Increase Strength and Stamina Yes   Intervention Provide advice, education, support and counseling about physical activity/exercise needs.;Develop an individualized exercise prescription for aerobic and resistive training based on initial evaluation findings, risk stratification, comorbidities and participant's personal goals.   Expected Outcomes Achievement of increased cardiorespiratory fitness and enhanced flexibility, muscular endurance and strength shown through measurements of functional capacity and personal statement of participant.   Diabetes Yes   Intervention Provide education about signs/symptoms and action to take for hypo/hyperglycemia.;Provide education about proper nutrition, including hydration, and aerobic/resistive exercise prescription along with prescribed medications to achieve blood glucose in normal ranges: Fasting glucose 65-99 mg/dL   Expected Outcomes Short Term: Participant verbalizes understanding of  the signs/symptoms and immediate care of hyper/hypoglycemia, proper foot care and importance of medication, aerobic/resistive exercise and nutrition plan for blood glucose control.;Long Term: Attainment of HbA1C < 7%.   Hypertension Yes   Intervention Provide education on lifestyle modifcations including regular physical activity/exercise, weight management, moderate sodium restriction and increased consumption of fresh fruit, vegetables,  and low fat dairy, alcohol moderation, and smoking cessation.;Monitor prescription use compliance.   Expected Outcomes Short Term: Continued assessment and intervention until BP is < 140/16m HG in hypertensive participants. < 130/869mHG in hypertensive participants with diabetes, heart failure or chronic kidney disease.;Long Term: Maintenance of blood pressure at goal levels.   Lipids Yes   Intervention Provide education and support for participant on nutrition & aerobic/resistive exercise along with prescribed medications to achieve LDL <7030mHDL >60m77m Expected Outcomes Short Term: Participant states understanding of desired cholesterol values and is compliant with medications prescribed. Participant is following exercise prescription and nutrition guidelines.;Long Term: Cholesterol controlled with medications as prescribed, with individualized exercise RX and with personalized nutrition plan. Value goals: LDL < 70mg91mL > 40 mg.      Core Components/Risk Factors/Patient Goals Review:      Goals and Risk Factor Review    Row Name 07/19/16 1502 08/07/16 1818 08/10/16 1306 09/18/16 1705 09/18/16 1725     Core Components/Risk Factors/Patient Goals Review   Personal Goals Review  -  -  - Weight Management/Obesity;Hypertension;Lipids;Diabetes  -   Review NancyNyima she is sorry that she can't attend Cardiac Rehab lately since she has doctor's appts.  8/10 chest pain while riding exercise recumbent bicycle. NancyAzul call her MD tomorrow since she is short of  breath at times also. NancyTalondal has some heart blockage.  NancyIzora Gala MD Dr. CalveDarrin Nippered and she is short of breath. Monday Nuclear Stress test and will see CArdiologist on Wednesday. I asked NancyCitlalyait until she sees MD on Wed till when she will return to Cardiac Rehab.   - NanIzora Galarted her HbgA1c is down form 10 to 6.8% as of her MD call to her today. Clothes are loose even though no weight changes are noted.  She hasbeen exercising at home and doing well. BP remains stable and her cholesterol numbers are good per NancyIzora Galaxpected Outcomes Cont heart healthy lifestyle. Free of chest pain.  Free of chest pain.  - Continue working on good nutrtion habits to help with risk factor control. Continue to maintain symptom free with exercise      Core Components/Risk Factors/Patient Goals at Discharge (Final Review):      Goals and Risk Factor Review - 09/18/16 1725      Core Components/Risk Factors/Patient Goals Review   Review NancyKattirted her HbgA1c is down form 10 to 6.8% as of her MD call to her today. Clothes are loose even though no weight changes are noted.  She hasbeen exercising at home and doing well. BP remains stable and her cholesterol numbers are good per NancyIzora Galaxpected Outcomes Continue working on good nutrtion habits to help with risk factor control. Continue to maintain symptom free with exercise      ITP Comments:     ITP Comments    Row Name 07/19/16 1500 07/26/16 1823 07/27/16 1209 08/02/16 0717 08/02/16 1711   ITP Comments NancyIzora Gala she is sorry that she can't attend Cardiac Rehab lately since she has doctor's appts.  Required glucose tabs for low blood sugar post exercise. NancyDalaryrogressing well with exercise. 30 day review. Continue with ITP unless changes noted by Medical Director at signature of review. Some chest discomfort while she was talking to the CardiElroyselor while she was working out on the recumbent bike. Once I had her  stop the bicycle her chest discomfort went  away. Izora Gala reports tthe MD has her on a medicine holiday from her statins since she was soooo sore.    [place]nee Name 08/07/16 1816 08/10/16 1304 08/24/16 1546 08/30/16 1114 09/27/16 0647   ITP Comments 8/10 chest pain while riding exercise recumbent bicycle. Kambrie will call her MD tomorrow since she is short of breath at times also. Titiana still has some heart blockage.  Izora Gala said MD Dr. Darrin Nipper called and she is short of breath. Monday Nuclear Stress test and will see CArdiologist on Wednesday. I asked Evanne to wait until she sees MD on Wed till when she will return to Cardiac Rehab.  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 She said they gave her a long lasting NTg type medicine (Isosorbide) but she did not get NTg sl. I told Nadiyah that she can return on Monday and we are both aware that she will exercise till her chest pain returns then rest for a while and exercise some more until her chest pain returns. etc.  30 day review. Continue with ITP unless changes noted by Medical Director at signature of review. Remains absent 30 day review completed for Medical Director physician review and signature. Continue ITP unless changes made by physician.   Kimmell Name 10/25/16 0641           ITP Comments 30 day review completed for review by Dr Emily Filbert.  Continue with ITP unless changes noted by Dr Sabra Heck.          Comments:

## 2016-10-26 ENCOUNTER — Encounter

## 2016-10-26 DIAGNOSIS — Z955 Presence of coronary angioplasty implant and graft: Secondary | ICD-10-CM

## 2016-10-26 NOTE — Progress Notes (Signed)
Daily Session Note  Patient Details  Name: Suzanne Bernard MRN: [id number] Date of Birth: 16-Jun-1951 Referring Provider:   Flowsheet Row Cardiac Rehab from 07/05/2016 in [facility]  Referring Provider  Cavender      Encounter Date: 10/26/2016  Check In:     Session Check In - 10/26/16 1737      Check-In   Staff Present Earlean Shawl, BS, ACSM CEP, Exercise Physiologist;Amanda Oletta Darter, BA, ACSM CEP, Exercise Physiologist;Other  Gilmore Laroche surles RN   Supervising physician immediately available to respond to emergencies See telemetry face sheet for immediately available ER MD   Medication changes reported     No   Fall or balance concerns reported    No   Warm-up and Cool-down Performed on first and last piece of equipment   Resistance Training Performed Yes   VAD Patient? No     Pain Assessment   Currently in Pain? No/denies   Multiple Pain Sites No         Goals Met:  Independence with exercise equipment Exercise tolerated well No report of cardiac concerns or symptoms Strength training completed today  Goals Unmet:  Not Applicable  Comments: Pt able to follow exercise prescription today without complaint.  Will continue to monitor for progression.    Dr. Emily Filbert is Medical Director for [facility] and [facility].

## 2016-10-27 NOTE — Patient Instructions (Signed)
Discharge Instructions  Patient Details  Name: Suzanne Bernard MRN: [id number] Date of Birth: [birth year] Referring Provider:  Rosana Hoesavender, Matthew Aaron*   Number of Visits: 36  Reason for Discharge:  Patient reached a stable level of exercise. Patient independent in their exercise.  Smoking History:  History  Smoking Status  . Not on file  Smokeless Tobacco  . Not on file    Diagnosis:  S/P coronary artery stent placement  Initial Exercise Prescription:     Initial Exercise Prescription - 07/05/16 1500      Date of Initial Exercise RX and Referring Provider   Date 07/05/16   Referring Provider Cavender     Treadmill   MPH 2.5   Grade 0   Minutes 15   METs 2.91     Recumbant Bike   Level 2   RPM 60   Minutes 15   METs 2.98     NuStep   Level 3   Minutes 15   METs 3     Arm Ergometer   Level 1   Minutes 15   METs 2.9     REL-XR   Level 2   Minutes 15   METs 3     T5 Nustep   Level 2   Minutes 15   METs 3     Biostep-RELP   Level 3   Minutes 15   METs 3     Prescription Details   Frequency (times per week) 3   Duration Progress to 45 minutes of aerobic exercise without signs/symptoms of physical distress     Intensity   THRR 40-80% of Max Heartrate 99-138   Ratings of Perceived Exertion 11-13   Perceived Dyspnea 0-4     Progression   Progression Continue to progress workloads to maintain intensity without signs/symptoms of physical distress.     Resistance Training   Training Prescription Yes   Weight 3   Reps 10-12      Discharge Exercise Prescription (Final Exercise Prescription Changes):     Exercise Prescription Changes - 10/19/16 1100      Exercise Review   Progression Yes     Response to Exercise   Blood Pressure (Admit) 132/82   Blood Pressure (Exercise) 146/72   Blood Pressure (Exit) 128/70   Heart Rate (Admit) 86 bpm   Heart Rate (Exercise) 113 bpm   Heart Rate (Exit) 86 bpm   Symptoms none   Duration  Progress to 45 minutes of aerobic exercise without signs/symptoms of physical distress   Intensity THRR unchanged     Progression   Progression Continue to progress workloads to maintain intensity without signs/symptoms of physical distress.   Average METs 2.45     Resistance Training   Training Prescription Yes   Weight 3   Reps 10-15     Interval Training   Interval Training No     NuStep   Level 4   Minutes 15   METs 2.6     Arm Ergometer   Level 1   Minutes 15   METs 2.3      Functional Capacity:     6 Minute Walk    Row Name 07/05/16 1516         6 Minute Walk   Distance 1333 feet     Walk Time 6 minutes     # of Rest Breaks 0     MPH 2.5     METS 2.94     RPE 8  VO2 Peak 10.3     Symptoms No     Resting HR 61 bpm     Resting BP 122/60     Max Ex. HR 90 bpm     Max Ex. BP 128/54        Quality of Life:     Quality of Life - 10/20/16 1002      Quality of Life Scores   Health/Function Pre 16.2 %   Health/Function Post 21.61 %   Health/Function % Change 33.4 %   Socioeconomic Pre 21.79 %   Socioeconomic Post 25.92 %   Socioeconomic % Change  18.95 %   Psych/Spiritual Pre 20.57 %   Psych/Spiritual Post 26.21 %   Psych/Spiritual % Change 27.42 %   Family Pre 14.9 %   Family Post 28.8 %   Family % Change 93.29 %   GLOBAL Pre 18.06 %   GLOBAL Post 24.55 %   GLOBAL % Change 35.94 %      Personal Goals: Goals established at orientation with interventions provided to work toward goal.     Personal Goals and Risk Factors at Admission - 07/05/16 1545      Core Components/Risk Factors/Patient Goals on Admission    Weight Management Yes   Intervention Weight Management: Develop a combined nutrition and exercise program designed to reach desired caloric intake, while maintaining appropriate intake of nutrient and fiber, sodium and fats, and appropriate energy expenditure required for the weight goal.;Weight Management: Provide education and  appropriate resources to help participant work on and attain dietary goals.;Weight Management/Obesity: Establish reasonable short term and long term weight goals.;Obesity: Provide education and appropriate resources to help participant work on and attain dietary goals.   Admit Weight 180 lb 3.2 oz (81.7 kg)   Goal Weight: Short Term 170 lb (77.1 kg)   Sedentary Yes   Intervention Provide advice, education, support and counseling about physical activity/exercise needs.;Develop an individualized exercise prescription for aerobic and resistive training based on initial evaluation findings, risk stratification, comorbidities and participant's personal goals.   Expected Outcomes Achievement of increased cardiorespiratory fitness and enhanced flexibility, muscular endurance and strength shown through measurements of functional capacity and personal statement of participant.   Increase Strength and Stamina Yes   Intervention Provide advice, education, support and counseling about physical activity/exercise needs.;Develop an individualized exercise prescription for aerobic and resistive training based on initial evaluation findings, risk stratification, comorbidities and participant's personal goals.   Expected Outcomes Achievement of increased cardiorespiratory fitness and enhanced flexibility, muscular endurance and strength shown through measurements of functional capacity and personal statement of participant.   Diabetes Yes   Intervention Provide education about signs/symptoms and action to take for hypo/hyperglycemia.;Provide education about proper nutrition, including hydration, and aerobic/resistive exercise prescription along with prescribed medications to achieve blood glucose in normal ranges: Fasting glucose 65-99 mg/dL   Expected Outcomes Short Term: Participant verbalizes understanding of the signs/symptoms and immediate care of hyper/hypoglycemia, proper foot care and importance of medication,  aerobic/resistive exercise and nutrition plan for blood glucose control.;Long Term: Attainment of HbA1C < 7%.   Hypertension Yes   Intervention Provide education on lifestyle modifcations including regular physical activity/exercise, weight management, moderate sodium restriction and increased consumption of fresh fruit, vegetables, and low fat dairy, alcohol moderation, and smoking cessation.;Monitor prescription use compliance.   Expected Outcomes Short Term: Continued assessment and intervention until BP is < 140/1390mm HG in hypertensive participants. < 130/3080mm HG in hypertensive participants with diabetes, heart failure or chronic kidney  disease.;Long Term: Maintenance of blood pressure at goal levels.   Lipids Yes   Intervention Provide education and support for participant on nutrition & aerobic/resistive exercise along with prescribed medications to achieve LDL 70mg , HDL >40mg .   Expected Outcomes Short Term: Participant states understanding of desired cholesterol values and is compliant with medications prescribed. Participant is following exercise prescription and nutrition guidelines.;Long Term: Cholesterol controlled with medications as prescribed, with individualized exercise RX and with personalized nutrition plan. Value goals: LDL < 70mg , HDL > 40 mg.       Personal Goals Discharge:     Goals and Risk Factor Review - 09/18/16 1725      Core Components/Risk Factors/Patient Goals Review   Review Suzanne Bernard reported her HbgA1c is down form 10 to 6.8% as of her MD call to her today. Clothes are loose even though no weight changes are noted.  She hasbeen exercising at home and doing well. BP remains stable and her cholesterol numbers are good per Suzanne Bernard.   Expected Outcomes Continue working on good nutrtion habits to help with risk factor control. Continue to maintain symptom free with exercise      Nutrition & Weight - Outcomes:     Pre Biometrics - 07/05/16 1516      Pre Biometrics    Height 5' 3.5" (1.613 m)   Weight 180 lb 3.2 oz (81.7 kg)   Waist Circumference 43.75 inches   Hip Circumference 43 inches   Waist to Hip Ratio 1.02 %   BMI (Calculated) 31.5   Single Leg Stand 19.27 seconds       Nutrition:     Nutrition Therapy & Goals - 07/18/16 1132      Nutrition Therapy   Diet 1350 kcal Diabetes and heart healthy diet   Protein (specify units) 6   Fiber 20 grams   Whole Grain Foods 3 servings   Saturated Fats 11 max. grams   Fruits and Vegetables 8 servings/day   Sodium 1500 grams     Personal Nutrition Goals   Personal Goal #1 Control food portions by using small plates and eating generous portions of low-cal vegetables.    Comments patient reports feeling hungry almost constantly, and also states she has gastroparesis, but no issues with nausea or vomiting at this time. Advised small amounts of food often during the day, and limiting fiber intake.      Intervention Plan   Intervention Prescribe, educate and counsel regarding individualized specific dietary modifications aiming towards targeted core components such as weight, hypertension, lipid management, diabetes, heart failure and other comorbidities.;Nutrition handout(s) given to patient.   Expected Outcomes Short Term Goal: Understand basic principles of dietary content, such as calories, fat, sodium, cholesterol and nutrients.;Short Term Goal: A plan has been developed with personal nutrition goals set during dietitian appointment.;Long Term Goal: Adherence to prescribed nutrition plan.      Nutrition Discharge:     Nutrition Assessments - 10/20/16 1002      Rate Your Plate Scores   Pre Score 66   Pre Score % 73 %   Post Score 80   Post Score % 88.9 %   % Change 15.9 %      Education Questionnaire Score:     Knowledge Questionnaire Score - 10/20/16 1002      Knowledge Questionnaire Score   Pre Score 20/28   Post Score 23/28      Goals reviewed with patient; copy given to  patient.

## 2016-10-30 ENCOUNTER — Encounter: Admitting: *Deleted

## 2016-10-30 VITALS — Ht 63.5 in | Wt 182.4 lb

## 2016-10-30 DIAGNOSIS — Z955 Presence of coronary angioplasty implant and graft: Secondary | ICD-10-CM | POA: Diagnosis not present

## 2016-10-30 NOTE — Patient Instructions (Signed)
Discharge Instructions  Patient Details  Name: Suzanne Bernard MRN: [id number] Date of Birth: 1951/06/27 Referring Provider:  Rosana Hoes*   Number of Visits: 36  Reason for Discharge:  Patient reached a stable level of exercise. Patient independent in their exercise.  Smoking History:  History  Smoking Status  . Not on file  Smokeless Tobacco  . Not on file    Diagnosis:  S/P coronary artery stent placement  Initial Exercise Prescription:     Initial Exercise Prescription - 07/05/16 1500      Date of Initial Exercise RX and Referring Provider   Date 07/05/16   Referring Provider Cavender     Treadmill   MPH 2.5   Grade 0   Minutes 15   METs 2.91     Recumbant Bike   Level 2   RPM 60   Minutes 15   METs 2.98     NuStep   Level 3   Minutes 15   METs 3     Arm Ergometer   Level 1   Minutes 15   METs 2.9     REL-XR   Level 2   Minutes 15   METs 3     T5 Nustep   Level 2   Minutes 15   METs 3     Biostep-RELP   Level 3   Minutes 15   METs 3     Prescription Details   Frequency (times per week) 3   Duration Progress to 45 minutes of aerobic exercise without signs/symptoms of physical distress     Intensity   THRR 40-80% of Max Heartrate 99-138   Ratings of Perceived Exertion 11-13   Perceived Dyspnea 0-4     Progression   Progression Continue to progress workloads to maintain intensity without signs/symptoms of physical distress.     Resistance Training   Training Prescription Yes   Weight 3   Reps 10-12      Discharge Exercise Prescription (Final Exercise Prescription Changes):     Exercise Prescription Changes - 10/19/16 1100      Exercise Review   Progression Yes     Response to Exercise   Blood Pressure (Admit) 132/82   Blood Pressure (Exercise) 146/72   Blood Pressure (Exit) 128/70   Heart Rate (Admit) 86 bpm   Heart Rate (Exercise) 113 bpm   Heart Rate (Exit) 86 bpm   Symptoms none   Duration  Progress to 45 minutes of aerobic exercise without signs/symptoms of physical distress   Intensity THRR unchanged     Progression   Progression Continue to progress workloads to maintain intensity without signs/symptoms of physical distress.   Average METs 2.45     Resistance Training   Training Prescription Yes   Weight 3   Reps 10-15     Interval Training   Interval Training No     NuStep   Level 4   Minutes 15   METs 2.6     Arm Ergometer   Level 1   Minutes 15   METs 2.3      Functional Capacity:     6 Minute Walk    Row Name 07/05/16 1516 10/30/16 1731       6 Minute Walk   Phase  - Discharge    Distance 1333 feet 1518 feet    Distance % Change  - 14 %    Walk Time 6 minutes 6 minutes    # of Rest Breaks  0 0    MPH 2.5 2.88    METS 2.94 2.88    RPE 8 17    VO2 Peak 10.3 11.7    Symptoms No No    Resting HR 61 bpm 77 bpm    Resting BP 122/60 110/70    Max Ex. HR 90 bpm 91 bpm    Max Ex. BP 128/54 148/60       Quality of Life:     Quality of Life - 10/20/16 1002      Quality of Life Scores   Health/Function Pre 16.2 %   Health/Function Post 21.61 %   Health/Function % Change 33.4 %   Socioeconomic Pre 21.79 %   Socioeconomic Post 25.92 %   Socioeconomic % Change  18.95 %   Psych/Spiritual Pre 20.57 %   Psych/Spiritual Post 26.21 %   Psych/Spiritual % Change 27.42 %   Family Pre 14.9 %   Family Post 28.8 %   Family % Change 93.29 %   GLOBAL Pre 18.06 %   GLOBAL Post 24.55 %   GLOBAL % Change 35.94 %      Personal Goals: Goals established at orientation with interventions provided to work toward goal.     Personal Goals and Risk Factors at Admission - 07/05/16 1545      Core Components/Risk Factors/Patient Goals on Admission    Weight Management Yes   Intervention Weight Management: Develop a combined nutrition and exercise program designed to reach desired caloric intake, while maintaining appropriate intake of nutrient and fiber,  sodium and fats, and appropriate energy expenditure required for the weight goal.;Weight Management: Provide education and appropriate resources to help participant work on and attain dietary goals.;Weight Management/Obesity: Establish reasonable short term and long term weight goals.;Obesity: Provide education and appropriate resources to help participant work on and attain dietary goals.   Admit Weight 180 lb 3.2 oz (81.7 kg)   Goal Weight: Short Term 170 lb (77.1 kg)   Sedentary Yes   Intervention Provide advice, education, support and counseling about physical activity/exercise needs.;Develop an individualized exercise prescription for aerobic and resistive training based on initial evaluation findings, risk stratification, comorbidities and participant's personal goals.   Expected Outcomes Achievement of increased cardiorespiratory fitness and enhanced flexibility, muscular endurance and strength shown through measurements of functional capacity and personal statement of participant.   Increase Strength and Stamina Yes   Intervention Provide advice, education, support and counseling about physical activity/exercise needs.;Develop an individualized exercise prescription for aerobic and resistive training based on initial evaluation findings, risk stratification, comorbidities and participant's personal goals.   Expected Outcomes Achievement of increased cardiorespiratory fitness and enhanced flexibility, muscular endurance and strength shown through measurements of functional capacity and personal statement of participant.   Diabetes Yes   Intervention Provide education about signs/symptoms and action to take for hypo/hyperglycemia.;Provide education about proper nutrition, including hydration, and aerobic/resistive exercise prescription along with prescribed medications to achieve blood glucose in normal ranges: Fasting glucose 65-99 mg/dL   Expected Outcomes Short Term: Participant verbalizes  understanding of the signs/symptoms and immediate care of hyper/hypoglycemia, proper foot care and importance of medication, aerobic/resistive exercise and nutrition plan for blood glucose control.;Long Term: Attainment of HbA1C < 7%.   Hypertension Yes   Intervention Provide education on lifestyle modifcations including regular physical activity/exercise, weight management, moderate sodium restriction and increased consumption of fresh fruit, vegetables, and low fat dairy, alcohol moderation, and smoking cessation.;Monitor prescription use compliance.   Expected Outcomes Short Term: Continued  assessment and intervention until BP is < 140/5390mm HG in hypertensive participants. < 130/3780mm HG in hypertensive participants with diabetes, heart failure or chronic kidney disease.;Long Term: Maintenance of blood pressure at goal levels.   Lipids Yes   Intervention Provide education and support for participant on nutrition & aerobic/resistive exercise along with prescribed medications to achieve LDL 70mg , HDL >40mg .   Expected Outcomes Short Term: Participant states understanding of desired cholesterol values and is compliant with medications prescribed. Participant is following exercise prescription and nutrition guidelines.;Long Term: Cholesterol controlled with medications as prescribed, with individualized exercise RX and with personalized nutrition plan. Value goals: LDL < 70mg , HDL > 40 mg.       Personal Goals Discharge:     Goals and Risk Factor Review - 09/18/16 1725      Core Components/Risk Factors/Patient Goals Review   Review Suzanne Bernard reported her HbgA1c is down form 10 to 6.8% as of her MD call to her today. Clothes are loose even though no weight changes are noted.  She hasbeen exercising at home and doing well. BP remains stable and her cholesterol numbers are good per Suzanne SineNancy.   Expected Outcomes Continue working on good nutrtion habits to help with risk factor control. Continue to maintain  symptom free with exercise      Nutrition & Weight - Outcomes:     Pre Biometrics - 07/05/16 1516      Pre Biometrics   Height 5' 3.5" (1.613 m)   Weight 180 lb 3.2 oz (81.7 kg)   Waist Circumference 43.75 inches   Hip Circumference 43 inches   Waist to Hip Ratio 1.02 %   BMI (Calculated) 31.5   Single Leg Stand 19.27 seconds         Post Biometrics - 10/30/16 1732       Post  Biometrics   Height 5' 3.5" (1.613 m)   Weight 182 lb 6.4 oz (82.7 kg)   Waist Circumference 40.5 inches   Hip Circumference 43 inches   Waist to Hip Ratio 0.94 %   BMI (Calculated) 31.9   Single Leg Stand 18.4 seconds      Nutrition:     Nutrition Therapy & Goals - 07/18/16 1132      Nutrition Therapy   Diet 1350 kcal Diabetes and heart healthy diet   Protein (specify units) 6   Fiber 20 grams   Whole Grain Foods 3 servings   Saturated Fats 11 max. grams   Fruits and Vegetables 8 servings/day   Sodium 1500 grams     Personal Nutrition Goals   Personal Goal #1 Control food portions by using small plates and eating generous portions of low-cal vegetables.    Comments patient reports feeling hungry almost constantly, and also states she has gastroparesis, but no issues with nausea or vomiting at this time. Advised small amounts of food often during the day, and limiting fiber intake.      Intervention Plan   Intervention Prescribe, educate and counsel regarding individualized specific dietary modifications aiming towards targeted core components such as weight, hypertension, lipid management, diabetes, heart failure and other comorbidities.;Nutrition handout(s) given to patient.   Expected Outcomes Short Term Goal: Understand basic principles of dietary content, such as calories, fat, sodium, cholesterol and nutrients.;Short Term Goal: A plan has been developed with personal nutrition goals set during dietitian appointment.;Long Term Goal: Adherence to prescribed nutrition plan.       Nutrition Discharge:     Nutrition Assessments - 10/20/16 1002  Rate Your Plate Scores   Pre Score 66   Pre Score % 73 %   Post Score 80   Post Score % 88.9 %   % Change 15.9 %      Education Questionnaire Score:     Knowledge Questionnaire Score - 10/20/16 1002      Knowledge Questionnaire Score   Pre Score 20/28   Post Score 23/28      Goals reviewed with patient; copy given to patient.

## 2016-10-30 NOTE — Progress Notes (Signed)
Cardiac Individual Treatment Plan  Patient Details  Name: Suzanne Bernard MRN: [id number] Date of Birth: [birth year] Referring Provider:   Flowsheet Row Cardiac Rehab from 07/05/2016 in [facility] Cardiac and Pulmonary Rehab  Referring Provider  Cavender      Initial Encounter Date:  Flowsheet Row Cardiac Rehab from 07/05/2016 in [facility] Cardiac and Pulmonary Rehab  Date  07/05/16  Referring Provider  Cavender      Visit Diagnosis: S/P coronary artery stent placement  Patient's Home Medications on Admission:  Current Outpatient Prescriptions:  .  allopurinol (ZYLOPRIM) 300 MG tablet, Take by mouth., Disp: , Rfl:  .  amLODipine (NORVASC) 10 MG tablet, Take by mouth., Disp: , Rfl:  .  carvedilol (COREG) 12.5 MG tablet, Take by mouth., Disp: , Rfl:  .  colestipol (COLESTID) 1 g tablet, TAKE 4 TABLETS DAILY, Disp: , Rfl:  .  gabapentin (NEURONTIN) 300 MG capsule, Take by mouth., Disp: , Rfl:  .  gemfibrozil (LOPID) 600 MG tablet, Take by mouth., Disp: , Rfl:  .  glimepiride (AMARYL) 4 MG tablet, Take by mouth 2 (two) times daily., Disp: , Rfl:  .  glucose blood (FREESTYLE LITE) test strip, , Disp: , Rfl:  .  Insulin Glargine (LANTUS SOLOSTAR) 100 UNIT/ML Solostar Pen, Inject into the skin., Disp: , Rfl:  .  isosorbide mononitrate (IMDUR) 30 MG 24 hr tablet, Take 30 mg by mouth., Disp: , Rfl:  .  losartan-hydrochlorothiazide (HYZAAR) 100-25 MG tablet, Take by mouth., Disp: , Rfl:  .  lubiprostone (AMITIZA) 24 MCG capsule, TAKE 1 CAPSULE TWICE A DAY WITH MEALS, Disp: , Rfl:  .  metFORMIN (GLUCOPHAGE) 1000 MG tablet, TAKE 1 TABLET TWICE A DAY, Disp: , Rfl:  .  montelukast (SINGULAIR) 10 MG tablet, Take by mouth., Disp: , Rfl:  .  ranitidine (ZANTAC) 150 MG capsule, Take by mouth., Disp: , Rfl:  .  sitaGLIPtin (JANUVIA) 100 MG tablet, once daily., Disp: , Rfl:   Past Medical History: No past medical history on file.  Tobacco Use: History  Smoking Status  . Not on file  Smokeless Tobacco   . Not on file    Labs: Recent Review Flowsheet Data    There is no flowsheet data to display.       Exercise Target Goals:    Exercise Program Goal: Individual exercise prescription set with THRR, safety & activity barriers. Participant demonstrates ability to understand and report RPE using BORG scale, to self-measure pulse accurately, and to acknowledge the importance of the exercise prescription.  Exercise Prescription Goal: Starting with aerobic activity 30 plus minutes a day, 3 days per week for initial exercise prescription. Provide home exercise prescription and guidelines that participant acknowledges understanding prior to discharge.  Activity Barriers & Risk Stratification:     Activity Barriers & Cardiac Risk Stratification - 07/05/16 1539      Activity Barriers & Cardiac Risk Stratification   Activity Barriers Back Problems;Deconditioning;Neck/Spine Problems;Shortness of Breath;Joint Problems      6 Minute Walk:     6 Minute Walk    Row Name 07/05/16 1516 10/30/16 1731       6 Minute Walk   Phase  - Discharge    Distance 1333 feet 1518 feet    Distance % Change  - 14 %    Walk Time 6 minutes 6 minutes    # of Rest Breaks 0 0    MPH 2.5 2.88    METS 2.94 2.88    RPE 8  17    VO2 Peak 10.3 11.7    Symptoms No No    Resting HR 61 bpm 77 bpm    Resting BP 122/60 110/70    Max Ex. HR 90 bpm 91 bpm    Max Ex. BP 128/54 148/60       Initial Exercise Prescription:     Initial Exercise Prescription - 07/05/16 1500      Date of Initial Exercise RX and Referring Provider   Date 07/05/16   Referring Provider Cavender     Treadmill   MPH 2.5   Grade 0   Minutes 15   METs 2.91     Recumbant Bike   Level 2   RPM 60   Minutes 15   METs 2.98     NuStep   Level 3   Minutes 15   METs 3     Arm Ergometer   Level 1   Minutes 15   METs 2.9     REL-XR   Level 2   Minutes 15   METs 3     T5 Nustep   Level 2   Minutes 15   METs 3      Biostep-RELP   Level 3   Minutes 15   METs 3     Prescription Details   Frequency (times per week) 3   Duration Progress to 45 minutes of aerobic exercise without signs/symptoms of physical distress     Intensity   THRR 40-80% of Max Heartrate 99-138   Ratings of Perceived Exertion 11-13   Perceived Dyspnea 0-4     Progression   Progression Continue to progress workloads to maintain intensity without signs/symptoms of physical distress.     Resistance Training   Training Prescription Yes   Weight 3   Reps 10-12      Perform Capillary Blood Glucose checks as needed.  Exercise Prescription Changes:     Exercise Prescription Changes    Row Name 07/27/16 1200 08/10/16 1100 09/07/16 1300 09/14/16 1700 09/20/16 1400     Exercise Review   Progression Yes Yes Yes  - Yes     Response to Exercise   Blood Pressure (Admit) 118/60 118/64 108/60  - 120/70   Blood Pressure (Exercise) 124/62 138/70 146/70  - 142/70   Blood Pressure (Exit) 126/64 130/62 120/60  - 130/80   Heart Rate (Admit) 53 bpm 70 bpm 62 bpm  - 60 bpm   Heart Rate (Exercise) 111 bpm 117 bpm 116 bpm  - 107 bpm   Heart Rate (Exit) 83 bpm 81 bpm 84 bpm  - 87 bpm   Rating of Perceived Exertion (Exercise) 15 15 14   - 13   Symptoms none chest pain   8/10 - resolved with rest none none none   Duration Progress to 45 minutes of aerobic exercise without signs/symptoms of physical distress Progress to 45 minutes of aerobic exercise without signs/symptoms of physical distress Progress to 45 minutes of aerobic exercise without signs/symptoms of physical distress Progress to 45 minutes of aerobic exercise without signs/symptoms of physical distress Progress to 45 minutes of aerobic exercise without signs/symptoms of physical distress   Intensity THRR unchanged THRR unchanged THRR unchanged THRR unchanged THRR unchanged     Progression   Progression Continue to progress workloads to maintain intensity without signs/symptoms of  physical distress. Continue to progress workloads to maintain intensity without signs/symptoms of physical distress. Continue to progress workloads to maintain intensity without signs/symptoms of physical distress.  Continue to progress workloads to maintain intensity without signs/symptoms of physical distress. Continue to progress workloads to maintain intensity without signs/symptoms of physical distress.   Average METs 2.2 2.48  -  - 2.58     Resistance Training   Training Prescription Yes Yes Yes Yes Yes   Weight 2 2 2 2 2    Reps 10-15 10-15 10-15 10-15 10-15     Interval Training   Interval Training No No No No No     Recumbant Bike   Level  - 1  -  - 1   RPM  - 60  -  - 60   Minutes  - 15  -  - 15   METs  -  -  -  - 3.06     NuStep   Level 1 1 1 1   -   Minutes 15 15 15 15   -   METs 2.4 1.9  -  -  -     Arm Ergometer   Level 1  -  -  - 1   Minutes 15  -  -  - 15   METs 2  -  -  - 2.1     Home Exercise Plan   Plans to continue exercise at  -  -  - Home  -   Frequency  -  -  - Add 2 additional days to program exercise sessions.  -   Row Name 10/06/16 0800 10/19/16 1100           Exercise Review   Progression Yes Yes        Response to Exercise   Blood Pressure (Admit) 122/74 132/82      Blood Pressure (Exercise) 132/70 146/72      Blood Pressure (Exit) 128/60 128/70      Heart Rate (Admit) 78 bpm 86 bpm      Heart Rate (Exercise) 100 bpm 113 bpm      Heart Rate (Exit) 77 bpm 86 bpm      Rating of Perceived Exertion (Exercise) 13  -      Symptoms none none      Duration Progress to 45 minutes of aerobic exercise without signs/symptoms of physical distress Progress to 45 minutes of aerobic exercise without signs/symptoms of physical distress      Intensity THRR unchanged THRR unchanged        Progression   Progression Continue to progress workloads to maintain intensity without signs/symptoms of physical distress. Continue to progress workloads to maintain  intensity without signs/symptoms of physical distress.      Average METs 2 2.45        Resistance Training   Training Prescription Yes Yes      Weight 3 3      Reps 10-15 10-15        Interval Training   Interval Training No No        NuStep   Level 6 4      Minutes 15 15      METs 2 2.6        Arm Ergometer   Level 1 1      Minutes 15 15      METs 2.1 2.3         Exercise Comments:     Exercise Comments    Row Name 07/27/16 1208 08/10/16 1145 09/07/16 1353 09/14/16 1705 09/20/16 1425   Exercise Comments Vashon is progressing well with exercise. Keera was recommended  to follow up with her cardiologist re: chest pain.   Gennifer is doing well after returning to exercise. Home exercise plan reviewed with patient.  Willadean is progressing well with exercise and we have discussed home exercise so she can add to her exercise here.   Row Name 10/06/16 8563 10/19/16 1152 10/30/16 1809       Exercise Comments Tila is progressing well and has moved up her weight strength training. Makaela continues to progress well with exercise. Alizaya graduated today from cardiac rehab with 36 sessions completed.  Details of the patient's exercise prescription and what She needs to do in order to continue the prescription and progress were discussed with patient.  Patient was given a copy of prescription and goals.  Patient verbalized understanding.  Izora Gala plans to continue to exercise by exercising at home.        Discharge Exercise Prescription (Final Exercise Prescription Changes):     Exercise Prescription Changes - 10/19/16 1100      Exercise Review   Progression Yes     Response to Exercise   Blood Pressure (Admit) 132/82   Blood Pressure (Exercise) 146/72   Blood Pressure (Exit) 128/70   Heart Rate (Admit) 86 bpm   Heart Rate (Exercise) 113 bpm   Heart Rate (Exit) 86 bpm   Symptoms none   Duration Progress to 45 minutes of aerobic exercise without signs/symptoms of physical distress    Intensity THRR unchanged     Progression   Progression Continue to progress workloads to maintain intensity without signs/symptoms of physical distress.   Average METs 2.45     Resistance Training   Training Prescription Yes   Weight 3   Reps 10-15     Interval Training   Interval Training No     NuStep   Level 4   Minutes 15   METs 2.6     Arm Ergometer   Level 1   Minutes 15   METs 2.3      Nutrition:  Target Goals: Understanding of nutrition guidelines, daily intake of sodium <1526m, cholesterol <2017m calories 30% from fat and 7% or less from saturated fats, daily to have 5 or more servings of fruits and vegetables.  Biometrics:     Pre Biometrics - 07/05/16 1516      Pre Biometrics   Height 5' 3.5" (1.613 m)   Weight 180 lb 3.2 oz (81.7 kg)   Waist Circumference 43.75 inches   Hip Circumference 43 inches   Waist to Hip Ratio 1.02 %   BMI (Calculated) 31.5   Single Leg Stand 19.27 seconds         Post Biometrics - 10/30/16 1732       Post  Biometrics   Height 5' 3.5" (1.613 m)   Weight 182 lb 6.4 oz (82.7 kg)   Waist Circumference 40.5 inches   Hip Circumference 43 inches   Waist to Hip Ratio 0.94 %   BMI (Calculated) 31.9   Single Leg Stand 18.4 seconds      Nutrition Therapy Plan and Nutrition Goals:     Nutrition Therapy & Goals - 07/18/16 1132      Nutrition Therapy   Diet 1350 kcal Diabetes and heart healthy diet   Protein (specify units) 6   Fiber 20 grams   Whole Grain Foods 3 servings   Saturated Fats 11 max. grams   Fruits and Vegetables 8 servings/day   Sodium 1500 grams     Personal Nutrition Goals  Personal Goal #1 Control food portions by using small plates and eating generous portions of low-cal vegetables.    Comments patient reports feeling hungry almost constantly, and also states she has gastroparesis, but no issues with nausea or vomiting at this time. Advised small amounts of food often during the day, and limiting  fiber intake.      Intervention Plan   Intervention Prescribe, educate and counsel regarding individualized specific dietary modifications aiming towards targeted core components such as weight, hypertension, lipid management, diabetes, heart failure and other comorbidities.;Nutrition handout(s) given to patient.   Expected Outcomes Short Term Goal: Understand basic principles of dietary content, such as calories, fat, sodium, cholesterol and nutrients.;Short Term Goal: A plan has been developed with personal nutrition goals set during dietitian appointment.;Long Term Goal: Adherence to prescribed nutrition plan.      Nutrition Discharge: Rate Your Plate Scores:     Nutrition Assessments - 10/20/16 1002      Rate Your Plate Scores   Pre Score 66   Pre Score % 73 %   Post Score 80   Post Score % 88.9 %   % Change 15.9 %      Nutrition Goals Re-Evaluation:     Nutrition Goals Re-Evaluation    Row Name 09/18/16 1701             Personal Goal #1 Re-Evaluation   Personal Goal #1 Control food portions by using small plates and eating generous portions of low-cal vegetables.        Goal Progress Seen Yes       Comments less snacking, states is happy  with this step.    Long term goal: Continue with steps towards healthy eating plan          Psychosocial: Target Goals: Acknowledge presence or absence of depression, maximize coping skills, provide positive support system. Participant is able to verbalize types and ability to use techniques and skills needed for reducing stress and depression.  Initial Review & Psychosocial Screening:     Initial Psych Review & Screening - 07/05/16 1547      Initial Review   Current issues with Current Stress Concerns   Source of Stress Concerns Chronic Illness   Comments Dalma's husband is currently in the hospital at [facility] for Atrial Flutter- she reports.     Family Dynamics   Good Support System? Yes     Barriers   Psychosocial  barriers to participate in program The patient should benefit from training in stress management and relaxation.     Screening Interventions   Interventions Encouraged to exercise      Quality of Life Scores:     Quality of Life - 10/20/16 1002      Quality of Life Scores   Health/Function Pre 16.2 %   Health/Function Post 21.61 %   Health/Function % Change 33.4 %   Socioeconomic Pre 21.79 %   Socioeconomic Post 25.92 %   Socioeconomic % Change  18.95 %   Psych/Spiritual Pre 20.57 %   Psych/Spiritual Post 26.21 %   Psych/Spiritual % Change 27.42 %   Family Pre 14.9 %   Family Post 28.8 %   Family % Change 93.29 %   GLOBAL Pre 18.06 %   GLOBAL Post 24.55 %   GLOBAL % Change 35.94 %      PHQ-9: Recent Review Flowsheet Data    Depression screen [facility] 2/9 10/20/2016 07/05/2016   Decreased Interest 2 0   Down, Depressed,  Hopeless 0 1   PHQ - 2 Score 2 1   Altered sleeping 2 1   Tired, decreased energy 1 3   Change in appetite 0 2   Feeling bad or failure about yourself  0 0   Trouble concentrating 0 1   Moving slowly or fidgety/restless 0 0   Suicidal thoughts 0 0   PHQ-9 Score 5 8   Difficult doing work/chores Not difficult at all -      Psychosocial Evaluation and Intervention:     Psychosocial Evaluation - 10/23/16 1734      Discharge Psychosocial Assessment & Intervention   Comments Counselor met with Ms. W today for discharge evaluation.  She reports this program has been "wonderful" for her and her energy level has increased significantly since she began coming.  She is sleeping better and reports the injections to reduce her cholesterol levels have been effective and that is encouraging as well.  Ms. Viona Gilmore plans to continue exercising beyond this program either in [employer] or joining a gym.  She also has purchased a recumbent bike but "hates riding it at home!"  Counselor commended Ms. W for her consistency in exercising and for the progress she has made on many  levels.         Psychosocial Re-Evaluation:     Psychosocial Re-Evaluation    [place] Name 07/19/16 1501 09/20/16 1731           Psychosocial Re-Evaluation   Comments Benedetta said she is sorry that she can't attend Cardiac Rehab lately since she has doctor's appts.  I told Mallie about [facility] for activities since she said she is new to the area and it is hard for she and her husband to meet friends.  Counselor follow up with Ms. W today reporting she is sleeping better and feeling stronging now since coming into this program.  She realized taking her medications late at night were impacting her sleep; so she has stopped this.  She also discontinued the Celexa and is not taking natural Melatonin and continues to sleep well and have a positive mood.  She states her stress with being overweight has not changed; but she continues to try to eat healthier.  Counselor commended Ms. W for her progress made and her commitment to consistency in exercising.  Counselor offered encouragement and support in her commitment to healthier eating.           Vocational Rehabilitation: Provide vocational rehab assistance to qualifying candidates.   Vocational Rehab Evaluation & Intervention:     Vocational Rehab - 07/05/16 1547      Initial Vocational Rehab Evaluation & Intervention   Assessment shows need for Vocational Rehabilitation No      Education: Education Goals: Education classes will be provided on a weekly basis, covering required topics. Participant will state understanding/return demonstration of topics presented.  Learning Barriers/Preferences:     Learning Barriers/Preferences - 07/05/16 1540      Learning Barriers/Preferences   Learning Barriers None   Learning Preferences Individual Instruction      Education Topics: General Nutrition Guidelines/Fats and Fiber: -Group instruction provided by verbal, written material, models and posters to present the general guidelines  for heart healthy nutrition. Gives an explanation and review of dietary fats and fiber. Flowsheet Row Cardiac Rehab from 10/30/2016 in [facility] Cardiac and Pulmonary Rehab  Date  10/02/16  Educator  SB  Instruction Review Code  2- meets goals/outcomes      Controlling  Sodium/Reading Food Labels: -Group verbal and written material supporting the discussion of sodium use in heart healthy nutrition. Review and explanation with models, verbal and written materials for utilization of the food label.   Exercise Physiology & Risk Factors: - Group verbal and written instruction with models to review the exercise physiology of the cardiovascular system and associated critical values. Details cardiovascular disease risk factors and the goals associated with each risk factor. Flowsheet Row Cardiac Rehab from 10/30/2016 in [facility] Cardiac and Pulmonary Rehab  Date  09/27/16  Educator  AS  Instruction Review Code  2- meets goals/outcomes      Aerobic Exercise & Resistance Training: - Gives group verbal and written discussion on the health impact of inactivity. On the components of aerobic and resistive training programs and the benefits of this training and how to safely progress through these programs. Flowsheet Row Cardiac Rehab from 10/30/2016 in [facility] Cardiac and Pulmonary Rehab  Date  10/16/16  Educator  K. Amedeo Plenty  Instruction Review Code  2- meets goals/outcomes      Flexibility, Balance, General Exercise Guidelines: - Provides group verbal and written instruction on the benefits of flexibility and balance training programs. Provides general exercise guidelines with specific guidelines to those with heart or lung disease. Demonstration and skill practice provided.   Stress Management: - Provides group verbal and written instruction about the health risks of elevated stress, cause of high stress, and healthy ways to reduce stress. Flowsheet Row Cardiac Rehab from 10/30/2016 in [facility] Cardiac and  Pulmonary Rehab  Date  09/06/16  Educator  kc  Instruction Review Code  2- meets goals/outcomes      Depression: - Provides group verbal and written instruction on the correlation between heart/lung disease and depressed mood, treatment options, and the stigmas associated with seeking treatment.   Anatomy & Physiology of the Heart: - Group verbal and written instruction and models provide basic cardiac anatomy and physiology, with the coronary electrical and arterial systems. Review of: AMI, Angina, Valve disease, Heart Failure, Cardiac Arrhythmia, Pacemakers, and the ICD. Flowsheet Row Cardiac Rehab from 10/30/2016 in [facility] Cardiac and Pulmonary Rehab  Date  10/23/16  Educator  SB  Instruction Review Code  2- meets goals/outcomes      Cardiac Procedures: - Group verbal and written instruction and models to describe the testing methods done to diagnose heart disease. Reviews the outcomes of the test results. Describes the treatment choices: Medical Management, Angioplasty, or Coronary Bypass Surgery. Flowsheet Row Cardiac Rehab from 10/30/2016 in [facility] Cardiac and Pulmonary Rehab  Date  10/30/16  Educator  C. [place]  Instruction Review Code  2- meets goals/outcomes      Cardiac Medications: - Group verbal and written instruction to review commonly prescribed medications for heart disease. Reviews the medication, class of the drug, and side effects. Includes the steps to properly store meds and maintain the prescription regimen. Flowsheet Row Cardiac Rehab from 10/30/2016 in [facility] Cardiac and Pulmonary Rehab  Date  09/18/16 Marisue Humble 1]  Educator  SB  Instruction Review Code  2- meets goals/outcomes      Go Sex-Intimacy & Heart Disease, Get SMART - Goal Setting: - Group verbal and written instruction through game format to discuss heart disease and the return to sexual intimacy. Provides group verbal and written material to discuss and apply goal setting through the application of  the S.M.A.R.T. Method. Flowsheet Row Cardiac Rehab from 10/30/2016 in [facility] Cardiac and Pulmonary Rehab  Date  10/30/16  Educator  C.  EnterkinRN  Instruction Review Code  2- meets goals/outcomes      Other Matters of the Heart: - Provides group verbal, written materials and models to describe Heart Failure, Angina, Valve Disease, and Diabetes in the realm of heart disease. Includes description of the disease process and treatment options available to the cardiac patient. Flowsheet Row Cardiac Rehab from 10/30/2016 in [facility] Cardiac and Pulmonary Rehab  Date  10/23/16  Educator  SB  Instruction Review Code  2- meets goals/outcomes      Exercise & Equipment Safety: - Individual verbal instruction and demonstration of equipment use and safety with use of the equipment. Flowsheet Row Cardiac Rehab from 10/30/2016 in [facility] Cardiac and Pulmonary Rehab  Date  07/05/16  Educator  C. EnterkinRN  Instruction Review Code  1- partially meets, needs review/practice      Infection Prevention: - Provides verbal and written material to individual with discussion of infection control including proper hand washing and proper equipment cleaning during exercise session. Flowsheet Row Cardiac Rehab from 10/30/2016 in [facility] Cardiac and Pulmonary Rehab  Date  07/05/16  Educator  C. EnterkinRN  Instruction Review Code  2- meets goals/outcomes      Falls Prevention: - Provides verbal and written material to individual with discussion of falls prevention and safety. Flowsheet Row Cardiac Rehab from 10/30/2016 in [facility] Cardiac and Pulmonary Rehab  Date  07/05/16  Educator  C. [place]  Instruction Review Code  2- meets goals/outcomes      Diabetes: - Individual verbal and written instruction to review signs/symptoms of diabetes, desired ranges of glucose level fasting, after meals and with exercise. Advice that pre and post exercise glucose checks will be done for 3 sessions at entry of  program. Flowsheet Row Cardiac Rehab from 10/30/2016 in [facility] Cardiac and Pulmonary Rehab  Date  07/05/16  Educator  C. ENterkinRN  Instruction Review Code  1- partially meets, needs review/practice       Knowledge Questionnaire Score:     Knowledge Questionnaire Score - 10/20/16 1002      Knowledge Questionnaire Score   Pre Score 20/28   Post Score 23/28      Core Components/Risk Factors/Patient Goals at Admission:     Personal Goals and Risk Factors at Admission - 07/05/16 1545      Core Components/Risk Factors/Patient Goals on Admission    Weight Management Yes   Intervention Weight Management: Develop a combined nutrition and exercise program designed to reach desired caloric intake, while maintaining appropriate intake of nutrient and fiber, sodium and fats, and appropriate energy expenditure required for the weight goal.;Weight Management: Provide education and appropriate resources to help participant work on and attain dietary goals.;Weight Management/Obesity: Establish reasonable short term and long term weight goals.;Obesity: Provide education and appropriate resources to help participant work on and attain dietary goals.   Admit Weight 180 lb 3.2 oz (81.7 kg)   Goal Weight: Short Term 170 lb (77.1 kg)   Sedentary Yes   Intervention Provide advice, education, support and counseling about physical activity/exercise needs.;Develop an individualized exercise prescription for aerobic and resistive training based on initial evaluation findings, risk stratification, comorbidities and participant's personal goals.   Expected Outcomes Achievement of increased cardiorespiratory fitness and enhanced flexibility, muscular endurance and strength shown through measurements of functional capacity and personal statement of participant.   Increase Strength and Stamina Yes   Intervention Provide advice, education, support and counseling about physical activity/exercise needs.;Develop an  individualized exercise prescription for aerobic and resistive training  based on initial evaluation findings, risk stratification, comorbidities and participant's personal goals.   Expected Outcomes Achievement of increased cardiorespiratory fitness and enhanced flexibility, muscular endurance and strength shown through measurements of functional capacity and personal statement of participant.   Diabetes Yes   Intervention Provide education about signs/symptoms and action to take for hypo/hyperglycemia.;Provide education about proper nutrition, including hydration, and aerobic/resistive exercise prescription along with prescribed medications to achieve blood glucose in normal ranges: Fasting glucose 65-99 mg/dL   Expected Outcomes Short Term: Participant verbalizes understanding of the signs/symptoms and immediate care of hyper/hypoglycemia, proper foot care and importance of medication, aerobic/resistive exercise and nutrition plan for blood glucose control.;Long Term: Attainment of HbA1C < 7%.   Hypertension Yes   Intervention Provide education on lifestyle modifcations including regular physical activity/exercise, weight management, moderate sodium restriction and increased consumption of fresh fruit, vegetables, and low fat dairy, alcohol moderation, and smoking cessation.;Monitor prescription use compliance.   Expected Outcomes Short Term: Continued assessment and intervention until BP is < 140/52m HG in hypertensive participants. < 130/857mHG in hypertensive participants with diabetes, heart failure or chronic kidney disease.;Long Term: Maintenance of blood pressure at goal levels.   Lipids Yes   Intervention Provide education and support for participant on nutrition & aerobic/resistive exercise along with prescribed medications to achieve LDL <7054mHDL >96m69m Expected Outcomes Short Term: Participant states understanding of desired cholesterol values and is compliant with medications  prescribed. Participant is following exercise prescription and nutrition guidelines.;Long Term: Cholesterol controlled with medications as prescribed, with individualized exercise RX and with personalized nutrition plan. Value goals: LDL < 70mg22mL > 40 mg.      Core Components/Risk Factors/Patient Goals Review:      Goals and Risk Factor Review    Row Name 07/19/16 1502 08/07/16 1818 08/10/16 1306 09/18/16 1705 09/18/16 1725     Core Components/Risk Factors/Patient Goals Review   Personal Goals Review  -  -  - Weight Management/Obesity;Hypertension;Lipids;Diabetes  -   Review NancyNola she is sorry that she can't attend Cardiac Rehab lately since she has doctor's appts.  8/10 chest pain while riding exercise recumbent bicycle. NancyMoya call her MD tomorrow since she is short of breath at times also. NancyOonal has some heart blockage.  NancyIzora Gala MD Dr. CalveDarrin Nippered and she is short of breath. Monday Nuclear Stress test and will see CArdiologist on Wednesday. I asked NancyMatteaait until she sees MD on Wed till when she will return to Cardiac Rehab.   - NanIzora Galarted her HbgA1c is down form 10 to 6.8% as of her MD call to her today. Clothes are loose even though no weight changes are noted.  She hasbeen exercising at home and doing well. BP remains stable and her cholesterol numbers are good per NancyIzora Galaxpected Outcomes Cont heart healthy lifestyle. Free of chest pain.  Free of chest pain.  - Continue working on good nutrtion habits to help with risk factor control. Continue to maintain symptom free with exercise      Core Components/Risk Factors/Patient Goals at Discharge (Final Review):      Goals and Risk Factor Review - 09/18/16 1725      Core Components/Risk Factors/Patient Goals Review   Review NancyJaseminerted her HbgA1c is down form 10 to 6.8% as of her MD call to her today. Clothes are loose even though no weight changes are noted.  She hasbeen exercising at home and doing  well. BP  remains stable and her cholesterol numbers are good per Izora Gala.   Expected Outcomes Continue working on good nutrtion habits to help with risk factor control. Continue to maintain symptom free with exercise      ITP Comments:     ITP Comments    Row Name 07/19/16 1500 07/26/16 1823 07/27/16 1209 08/02/16 0717 08/02/16 1711   ITP Comments Izora Gala said she is sorry that she can't attend Cardiac Rehab lately since she has doctor's appts.  Required glucose tabs for low blood sugar post exercise. Anner is progressing well with exercise. 30 day review. Continue with ITP unless changes noted by Medical Director at signature of review. Some chest discomfort while she was talking to the [place] Counselor while she was working out on the recumbent bike. Once I had her stop the bicycle her chest discomfort went away. Marjarie reports tthe MD has her on a medicine holiday from her statins since she was soooo sore.    [place] Name 08/07/16 1816 08/10/16 1304 08/24/16 1546 08/30/16 1114 09/27/16 0647   ITP Comments 8/10 chest pain while riding exercise recumbent bicycle. Celene will call her MD tomorrow since she is short of breath at times also. Jerricka still has some heart blockage.  Izora Gala said MD Dr. Darrin Nipper called and she is short of breath. Monday Nuclear Stress test and will see CArdiologist on Wednesday. I asked Shastina to wait until she sees MD on Wed till when she will return to Cardiac Rehab.  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 She said they gave her a long lasting NTg type medicine (Isosorbide) but she did not get NTg sl. I told Zaakirah that she can return on Monday and we are both aware that she will exercise till her chest pain returns then rest for a while and exercise some more until her chest pain returns. etc.  30 day review. Continue with ITP unless changes noted by Medical Director at signature  of review. Remains absent 30 day review completed for Medical Director physician review and signature. Continue ITP unless changes made by physician.   [place] Name 10/25/16 0641 10/30/16 1838         ITP Comments 30 day review completed for review by Dr Emily Filbert.  Continue with ITP unless changes noted by Dr Sabra Heck. Discharged         Comments: discharged

## 2016-10-30 NOTE — Progress Notes (Signed)
Daily Session Note  Patient Details  Name: Suzanne Bernard MRN: [id number] Date of Birth: 06/20/1951 Referring Provider:   Flowsheet Row Cardiac Rehab from 07/05/2016 in [facility] Cardiac and Pulmonary Rehab  Referring Provider  Cavender      Encounter Date: 10/30/2016  Check In:     Session Check In - 10/30/16 1640      Check-In   Location [facility]-Cardiac & Pulmonary Rehab   Staff Present Gerlene Burdock, RN, BSN;[name], RN, BSN, Laveda Norman, BS, ACSM CEP, Exercise Physiologist   Supervising physician immediately available to respond to emergencies See telemetry face sheet for immediately available ER MD   Medication changes reported     No   Fall or balance concerns reported    No   Warm-up and Cool-down Performed on first and last piece of equipment   Resistance Training Performed Yes   VAD Patient? No     Pain Assessment   Currently in Pain? No/denies   Multiple Pain Sites No         Goals Met:  Independence with exercise equipment Exercise tolerated well Personal goals reviewed No report of cardiac concerns or symptoms Strength training completed today  Goals Unmet:  Not Applicable  Comments:  Suzanne Bernard graduated today from cardiac rehab with 36 sessions completed.  Details of the patient's exercise prescription and what She needs to do in order to continue the prescription and progress were discussed with patient.  Patient was given a copy of prescription and goals.  Patient verbalized understanding.  Suzanne Bernard plans to continue to exercise by exercising at home.    Dr. Emily Filbert is Medical Director for [facility].

## 2016-10-30 NOTE — Progress Notes (Signed)
Discharge Summary  Patient Details  Name: Suzanne Bernard MRN: [id number] Date of Birth: [birth year] Referring Provider:   Flowsheet Row Cardiac Rehab from 07/05/2016 in [facility] Cardiac and Pulmonary Rehab  Referring Provider  Cavender       Number of Visits: 47  Reason for Discharge:  Patient reached a stable level of exercise. Patient independent in their exercise.  Smoking History:  History  Smoking Status  . Not on file  Smokeless Tobacco  . Not on file    Diagnosis:  S/P coronary artery stent placement  ADL UCSD:   Initial Exercise Prescription:     Initial Exercise Prescription - 07/05/16 1500      Date of Initial Exercise RX and Referring Provider   Date 07/05/16   Referring Provider Cavender     Treadmill   MPH 2.5   Grade 0   Minutes 15   METs 2.91     Recumbant Bike   Level 2   RPM 60   Minutes 15   METs 2.98     NuStep   Level 3   Minutes 15   METs 3     Arm Ergometer   Level 1   Minutes 15   METs 2.9     REL-XR   Level 2   Minutes 15   METs 3     T5 Nustep   Level 2   Minutes 15   METs 3     Biostep-RELP   Level 3   Minutes 15   METs 3     Prescription Details   Frequency (times per week) 3   Duration Progress to 45 minutes of aerobic exercise without signs/symptoms of physical distress     Intensity   THRR 40-80% of Max Heartrate 99-138   Ratings of Perceived Exertion 11-13   Perceived Dyspnea 0-4     Progression   Progression Continue to progress workloads to maintain intensity without signs/symptoms of physical distress.     Resistance Training   Training Prescription Yes   Weight 3   Reps 10-12      Discharge Exercise Prescription (Final Exercise Prescription Changes):     Exercise Prescription Changes - 10/19/16 1100      Exercise Review   Progression Yes     Response to Exercise   Blood Pressure (Admit) 132/82   Blood Pressure (Exercise) 146/72   Blood Pressure (Exit) 128/70   Heart Rate (Admit)  86 bpm   Heart Rate (Exercise) 113 bpm   Heart Rate (Exit) 86 bpm   Symptoms none   Duration Progress to 45 minutes of aerobic exercise without signs/symptoms of physical distress   Intensity THRR unchanged     Progression   Progression Continue to progress workloads to maintain intensity without signs/symptoms of physical distress.   Average METs 2.45     Resistance Training   Training Prescription Yes   Weight 3   Reps 10-15     Interval Training   Interval Training No     NuStep   Level 4   Minutes 15   METs 2.6     Arm Ergometer   Level 1   Minutes 15   METs 2.3      Functional Capacity:     6 Minute Walk    Row Name 07/05/16 1516 10/30/16 1731       6 Minute Walk   Phase  - Discharge    Distance 1333 feet 1518 feet  Distance % Change  - 14 %    Walk Time 6 minutes 6 minutes    # of Rest Breaks 0 0    MPH 2.5 2.88    METS 2.94 2.88    RPE 8 17    VO2 Peak 10.3 11.7    Symptoms No No    Resting HR 61 bpm 77 bpm    Resting BP 122/60 110/70    Max Ex. HR 90 bpm 91 bpm    Max Ex. BP 128/54 148/60       Psychological, QOL, Others - Outcomes: PHQ 2/9: [facility] 10/20/2016 07/05/2016  Decreased Interest 2 0  Down, Depressed, Hopeless 0 1  PHQ - 2 Score 2 1  Altered sleeping 2 1  Tired, decreased energy 1 3  Change in appetite 0 2  Feeling bad or failure about yourself  0 0  Trouble concentrating 0 1  Moving slowly or fidgety/restless 0 0  Suicidal thoughts 0 0  PHQ-9 Score 5 8  Difficult doing work/chores Not difficult at all -    Quality of Life:     Quality of Life - 10/20/16 1002      Quality of Life Scores   Health/Function Pre 16.2 %   Health/Function Post 21.61 %   Health/Function % Change 33.4 %   Socioeconomic Pre 21.79 %   Socioeconomic Post 25.92 %   Socioeconomic % Change  18.95 %   Psych/Spiritual Pre 20.57 %   Psych/Spiritual Post 26.21 %   Psych/Spiritual % Change 27.42 %   Family Pre 14.9 %   Family  Post 28.8 %   Family % Change 93.29 %   GLOBAL Pre 18.06 %   GLOBAL Post 24.55 %   GLOBAL % Change 35.94 %      Personal Goals: Goals established at orientation with interventions provided to work toward goal.     Personal Goals and Risk Factors at Admission - 07/05/16 1545      Core Components/Risk Factors/Patient Goals on Admission    Weight Management Yes   Intervention Weight Management: Develop a combined nutrition and exercise program designed to reach desired caloric intake, while maintaining appropriate intake of nutrient and fiber, sodium and fats, and appropriate energy expenditure required for the weight goal.;Weight Management: Provide education and appropriate resources to help participant work on and attain dietary goals.;Weight Management/Obesity: Establish reasonable short term and long term weight goals.;Obesity: Provide education and appropriate resources to help participant work on and attain dietary goals.   Admit Weight 180 lb 3.2 oz (81.7 kg)   Goal Weight: Short Term 170 lb (77.1 kg)   Sedentary Yes   Intervention Provide advice, education, support and counseling about physical activity/exercise needs.;Develop an individualized exercise prescription for aerobic and resistive training based on initial evaluation findings, risk stratification, comorbidities and participant's personal goals.   Expected Outcomes Achievement of increased cardiorespiratory fitness and enhanced flexibility, muscular endurance and strength shown through measurements of functional capacity and personal statement of participant.   Increase Strength and Stamina Yes   Intervention Provide advice, education, support and counseling about physical activity/exercise needs.;Develop an individualized exercise prescription for aerobic and resistive training based on initial evaluation findings, risk stratification, comorbidities and participant's personal goals.   Expected Outcomes Achievement of  increased cardiorespiratory fitness and enhanced flexibility, muscular endurance and strength shown through measurements of functional capacity and personal statement of participant.   Diabetes Yes   Intervention Provide education about signs/symptoms and action  to take for hypo/hyperglycemia.;Provide education about proper nutrition, including hydration, and aerobic/resistive exercise prescription along with prescribed medications to achieve blood glucose in normal ranges: Fasting glucose 65-99 mg/dL   Expected Outcomes Short Term: Participant verbalizes understanding of the signs/symptoms and immediate care of hyper/hypoglycemia, proper foot care and importance of medication, aerobic/resistive exercise and nutrition plan for blood glucose control.;Long Term: Attainment of HbA1C < 7%.   Hypertension Yes   Intervention Provide education on lifestyle modifcations including regular physical activity/exercise, weight management, moderate sodium restriction and increased consumption of fresh fruit, vegetables, and low fat dairy, alcohol moderation, and smoking cessation.;Monitor prescription use compliance.   Expected Outcomes Short Term: Continued assessment and intervention until BP is < 140/2690mm HG in hypertensive participants. < 130/5180mm HG in hypertensive participants with diabetes, heart failure or chronic kidney disease.;Long Term: Maintenance of blood pressure at goal levels.   Lipids Yes   Intervention Provide education and support for participant on nutrition & aerobic/resistive exercise along with prescribed medications to achieve LDL 70mg , HDL >40mg .   Expected Outcomes Short Term: Participant states understanding of desired cholesterol values and is compliant with medications prescribed. Participant is following exercise prescription and nutrition guidelines.;Long Term: Cholesterol controlled with medications as prescribed, with individualized exercise RX and with personalized nutrition plan.  Value goals: LDL < 70mg , HDL > 40 mg.       Personal Goals Discharge:     Goals and Risk Factor Review    Row Name 07/19/16 1502 08/07/16 1818 08/10/16 1306 09/18/16 1705 09/18/16 1725     Core Components/Risk Factors/Patient Goals Review   Personal Goals Review  -  -  - Weight Management/Obesity;Hypertension;Lipids;Diabetes  -   Review Suzanne Bernard said she is sorry that she can't attend Cardiac Rehab lately since she has doctor's appts.  8/10 chest pain while riding exercise recumbent bicycle. Suzanne Bernard will call her MD tomorrow since she is short of breath at times also. Suzanne Bernard still has some heart blockage.  Suzanne Bernard said MD Dr. Sarina Illalvendar called and she is short of breath. Monday Nuclear Stress test and will see CArdiologist on Wednesday. I asked Suzanne Bernard to wait until she sees MD on Wed till when she will return to Cardiac Rehab.   Suzanne Bernard reported her HbgA1c is down form 10 to 6.8% as of her MD call to her today. Clothes are loose even though no weight changes are noted.  She hasbeen exercising at home and doing well. BP remains stable and her cholesterol numbers are good per Suzanne Bernard.   Expected Outcomes Cont heart healthy lifestyle. Free of chest pain.  Free of chest pain.  - Continue working on good nutrtion habits to help with risk factor control. Continue to maintain symptom free with exercise      Nutrition & Weight - Outcomes:     Pre Biometrics - 07/05/16 1516      Pre Biometrics   Height 5' 3.5" (1.613 m)   Weight 180 lb 3.2 oz (81.7 kg)   Waist Circumference 43.75 inches   Hip Circumference 43 inches   Waist to Hip Ratio 1.02 %   BMI (Calculated) 31.5   Single Leg Stand 19.27 seconds         Post Biometrics - 10/30/16 1732       Post  Biometrics   Height 5' 3.5" (1.613 m)   Weight 182 lb 6.4 oz (82.7 kg)   Waist Circumference 40.5 inches   Hip Circumference 43 inches   Waist to Hip Ratio 0.94 %  BMI (Calculated) 31.9   Single Leg Stand 18.4 seconds      Nutrition:      Nutrition Therapy & Goals - 07/18/16 1132      Nutrition Therapy   Diet 1350 kcal Diabetes and heart healthy diet   Protein (specify units) 6   Fiber 20 grams   Whole Grain Foods 3 servings   Saturated Fats 11 max. grams   Fruits and Vegetables 8 servings/day   Sodium 1500 grams     Personal Nutrition Goals   Personal Goal #1 Control food portions by using small plates and eating generous portions of low-cal vegetables.    Comments patient reports feeling hungry almost constantly, and also states she has gastroparesis, but no issues with nausea or vomiting at this time. Advised small amounts of food often during the day, and limiting fiber intake.      Intervention Plan   Intervention Prescribe, educate and counsel regarding individualized specific dietary modifications aiming towards targeted core components such as weight, hypertension, lipid management, diabetes, heart failure and other comorbidities.;Nutrition handout(s) given to patient.   Expected Outcomes Short Term Goal: Understand basic principles of dietary content, such as calories, fat, sodium, cholesterol and nutrients.;Short Term Goal: A plan has been developed with personal nutrition goals set during dietitian appointment.;Long Term Goal: Adherence to prescribed nutrition plan.      Nutrition Discharge:     Nutrition Assessments - 10/20/16 1002      Rate Your Plate Scores   Pre Score 66   Pre Score % 73 %   Post Score 80   Post Score % 88.9 %   % Change 15.9 %      Education Questionnaire Score:     Knowledge Questionnaire Score - 10/20/16 1002      Knowledge Questionnaire Score   Pre Score 20/28   Post Score 23/28      Goals reviewed with patient; copy given to patient.

## 2016-11-01 ENCOUNTER — Encounter

## 2016-11-02 ENCOUNTER — Encounter

## 2016-11-06 ENCOUNTER — Encounter

## 2016-11-08 ENCOUNTER — Encounter

## 2016-11-09 ENCOUNTER — Encounter

## 2016-11-15 ENCOUNTER — Encounter

## 2016-11-16 ENCOUNTER — Encounter

## 2016-11-22 ENCOUNTER — Encounter

## 2016-11-23 ENCOUNTER — Encounter

## 2016-11-27 ENCOUNTER — Encounter

## 2016-11-29 ENCOUNTER — Encounter

## 2016-11-30 ENCOUNTER — Encounter

## 2016-12-04 ENCOUNTER — Encounter

## 2016-12-06 ENCOUNTER — Encounter

## 2017-09-10 ENCOUNTER — Other Ambulatory Visit: Payer: Self-pay | Admitting: Geriatric Medicine

## 2017-09-10 DIAGNOSIS — Z1231 Encounter for screening mammogram for malignant neoplasm of breast: Secondary | ICD-10-CM

## 2017-09-13 ENCOUNTER — Ambulatory Visit
Admission: RE | Admit: 2017-09-13 | Discharge: 2017-09-13 | Disposition: A | Discharge status: Home / Self Care | Source: Ambulatory Visit | Attending: Geriatric Medicine | Admitting: Geriatric Medicine

## 2017-09-13 DIAGNOSIS — Z1231 Encounter for screening mammogram for malignant neoplasm of breast: Secondary | ICD-10-CM | POA: Diagnosis present

## 2018-08-20 ENCOUNTER — Other Ambulatory Visit: Payer: Self-pay | Admitting: Geriatric Medicine

## 2018-08-20 DIAGNOSIS — Z1231 Encounter for screening mammogram for malignant neoplasm of breast: Secondary | ICD-10-CM

## 2018-09-16 ENCOUNTER — Ambulatory Visit: Payer: PRIVATE HEALTH INSURANCE

## 2020-12-29 ENCOUNTER — Other Ambulatory Visit: Payer: Self-pay | Admitting: Rehabilitation

## 2020-12-29 DIAGNOSIS — M5417 Radiculopathy, lumbosacral region: Secondary | ICD-10-CM

## 2021-01-16 ENCOUNTER — Other Ambulatory Visit: Payer: Self-pay

## 2021-01-16 ENCOUNTER — Ambulatory Visit
Admission: RE | Admit: 2021-01-16 | Discharge: 2021-01-16 | Disposition: A | Discharge status: Home / Self Care | Payer: Medicare Other | Source: Ambulatory Visit | Attending: Rehabilitation | Admitting: Rehabilitation

## 2021-01-16 DIAGNOSIS — M5417 Radiculopathy, lumbosacral region: Secondary | ICD-10-CM

## 2021-07-25 IMAGING — MR MR LUMBAR SPINE W/O CM
4 of 5 series · 26 of 48 positions shown · non-contrast
Comparison: Lumbar MRI 10/09/2014.

CLINICAL DATA: 69-year-old female with low back pain radiating to
both hips, buttocks and legs with numbness in the left leg to the
foot for 2 months.

EXAM:
MRI LUMBAR SPINE WITHOUT CONTRAST
TECHNIQUE: Multiplanar, multisequence MR imaging of the lumbar spine was
performed. No intravenous contrast was administered.

[Series 3: T2 · sagittal · 4.0mm · 1.09mm/px · 6 of 16 slices shown (1 of 2)]
[im 1/16]
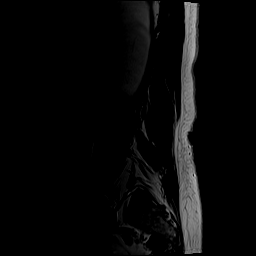
[im 4/16]
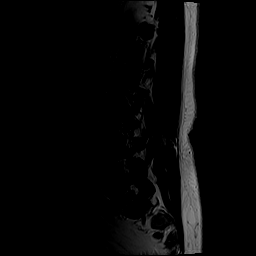
[im 7/16]
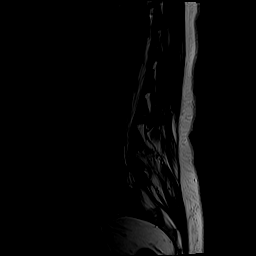
[im 10/16]
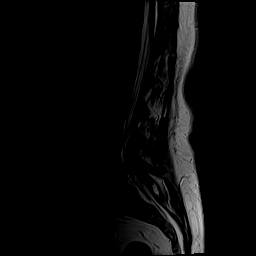
[im 13/16]
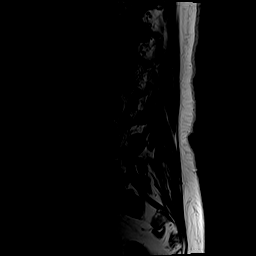
[im 16/16]
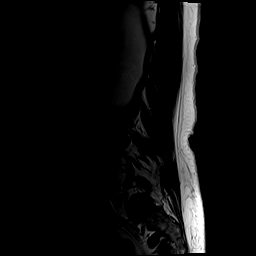

[Series 5: T1 · sagittal · 4.0mm · 1.09mm/px · 5 of 16 slices shown (1 of 2)]
[im 1/16]
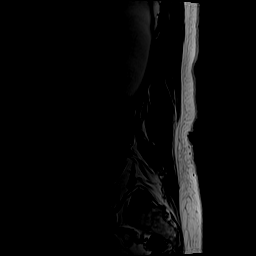
[im 4/16]
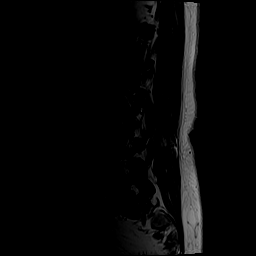
[im 8/16]
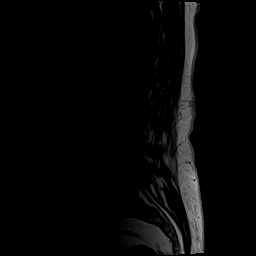
[im 12/16]
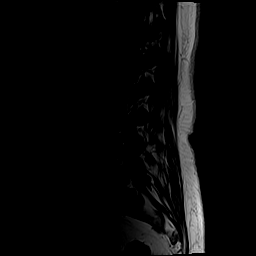
[im 16/16]
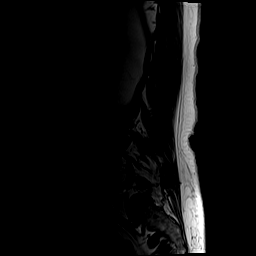

[Series 6: T2 · axial · 4.0mm · 0.39mm/px · z∈[-118,+83]mm · 10 of 46 slices shown (2 of 2)]
[im 4/46]
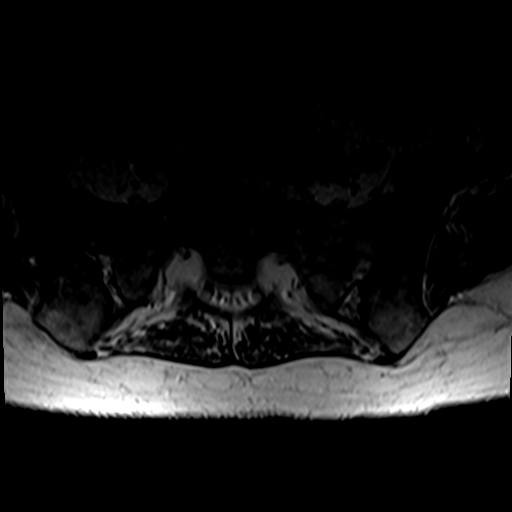
[im 7/46]
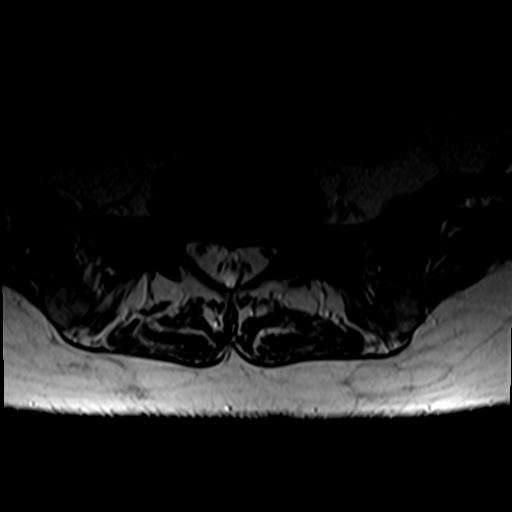
[im 10/46]
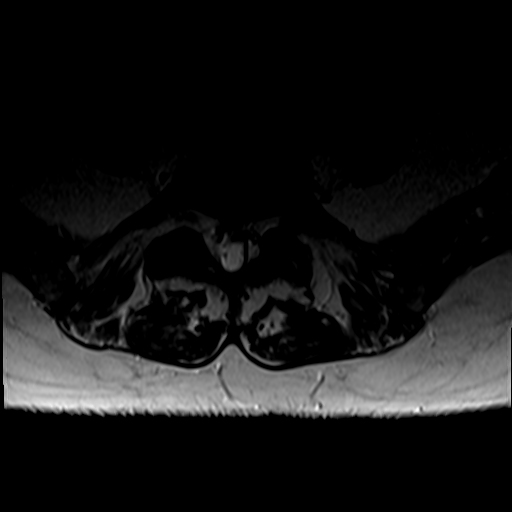
[im 16/46]
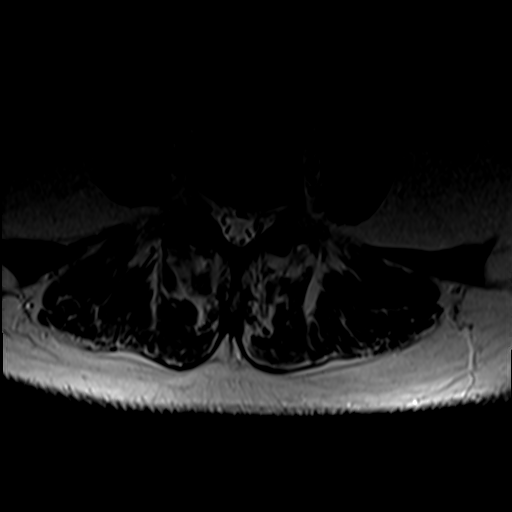
[im 22/46]
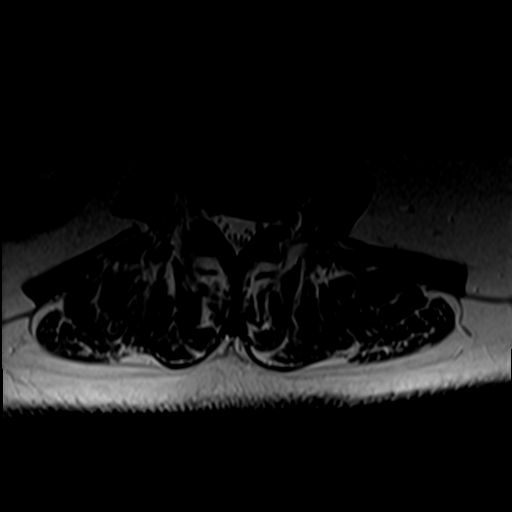
[im 25/46]
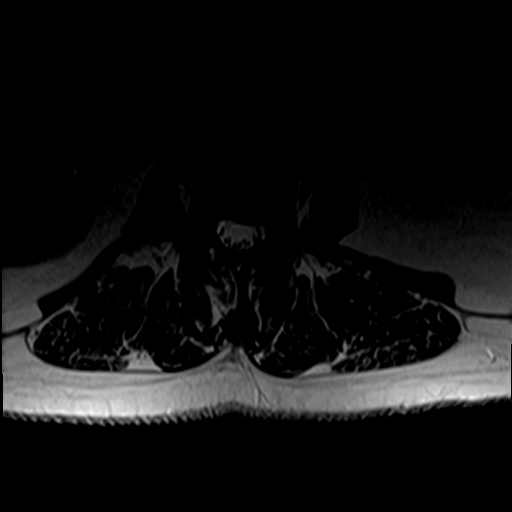
[im 28/46]
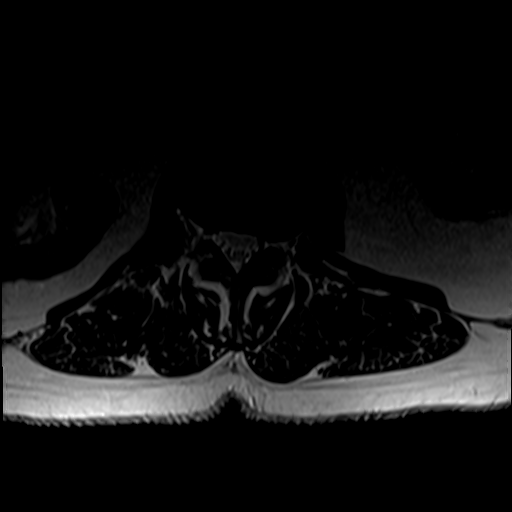
[im 34/46]
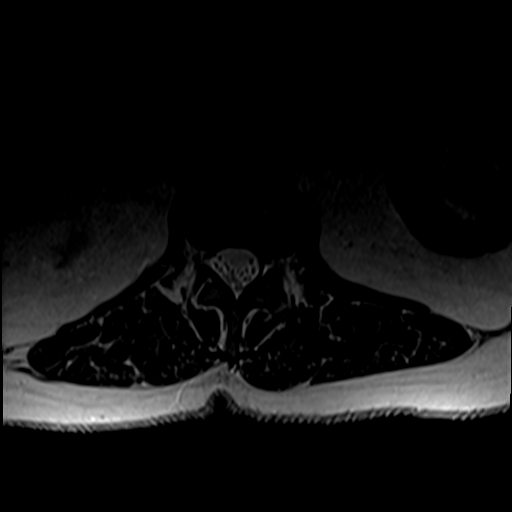
[im 40/46]
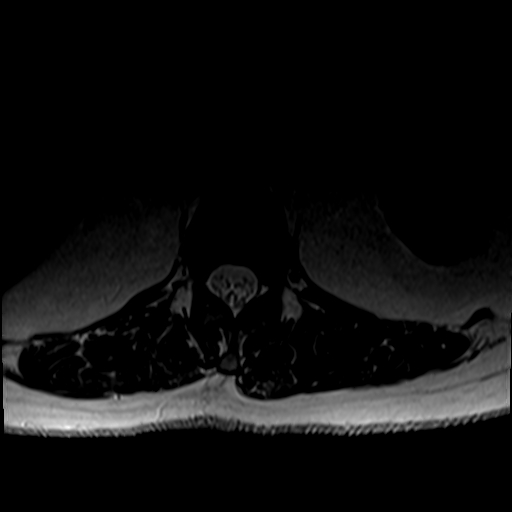
[im 46/46]
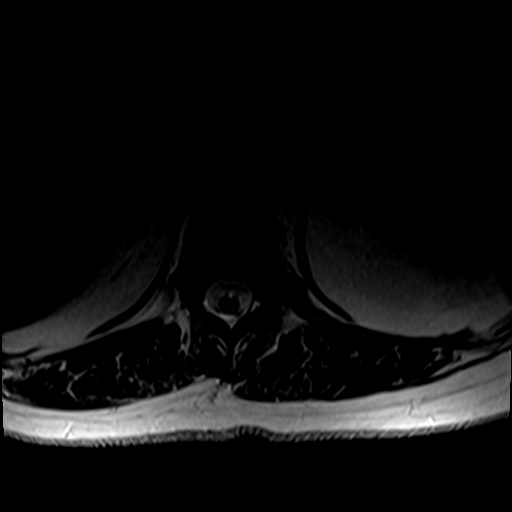

[Series 7: T1 · axial · 4.0mm · 0.39mm/px · z∈[-118,+55]mm · 5 of 46 slices shown (2 of 2)]
[im 4/46]
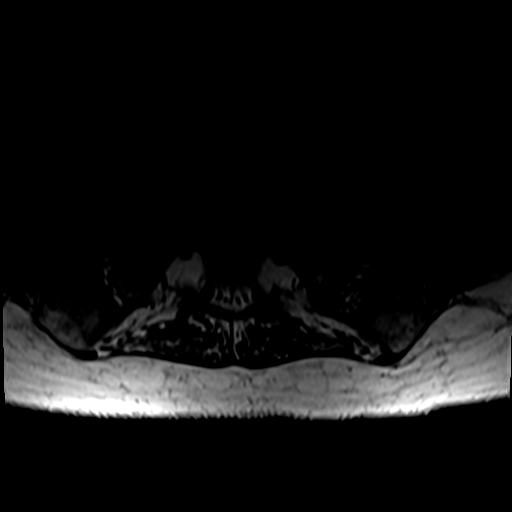
[im 7/46]
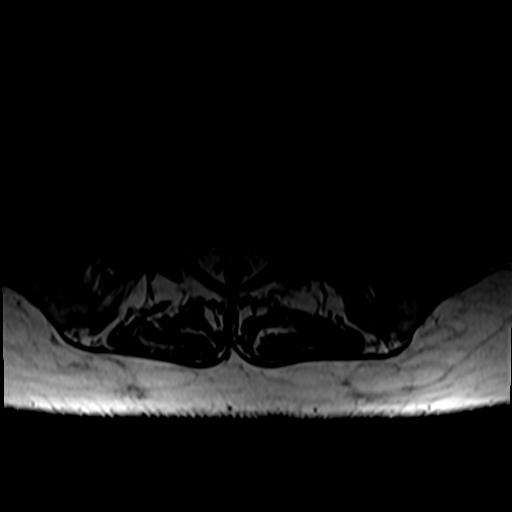
[im 10/46]
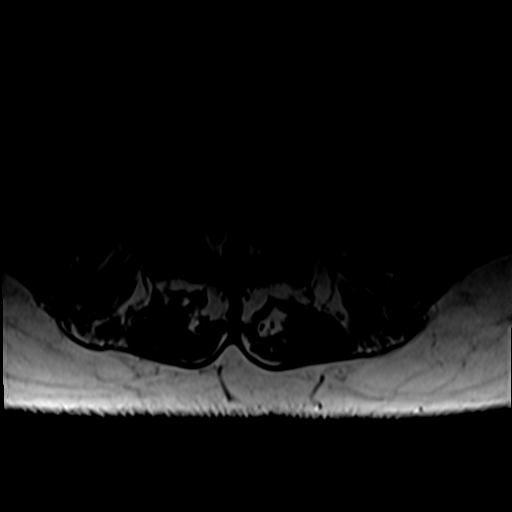
[im 25/46]
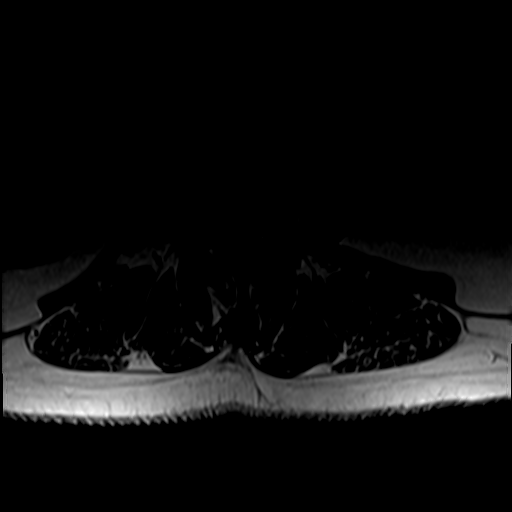
[im 40/46]
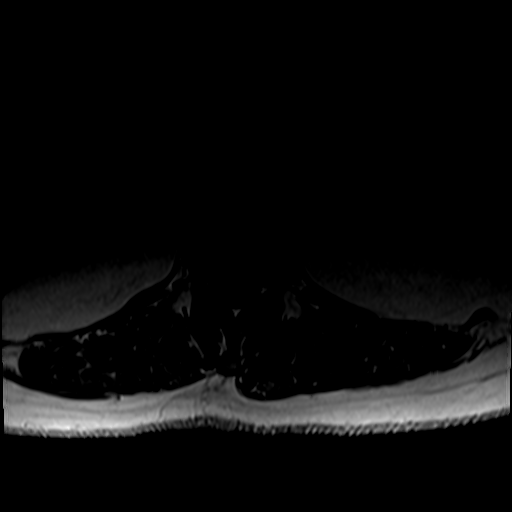

[26 of 48 positions shown; findings below may reference images not displayed]

FINDINGS: Segmentation: Lumbar segmentation appears to be normal, which is the
same numbering system used on the 5728 MRI.

Alignment: Stable lumbar lordosis. Subtle anterolisthesis of L4 on
L5 is new.

Vertebrae: Chronic degenerative endplate marrow signal changes at
L5-S1. No marrow edema or evidence of acute osseous abnormality.
Normal background bone marrow signal. Intact visible sacrum and SI
joints.

Conus medullaris and cauda equina: Conus extends to the T12-L1
level. No lower spinal cord or conus signal abnormality.

Paraspinal and other soft tissues: Negative.

Disc levels:

T11-T12 through L1-L2 remain negative.

L2-L3: Minimal disc bulge and posterior element hypertrophy.
Broad-based left foraminal disc. Mild left L2 foraminal stenosis.

L3-L4: Mild disc desiccation. Mild circumferential disc bulge.
Increased mild facet and mild to moderate ligament flavum
hypertrophy. Increased degenerative facet joint fluid. No spinal or
lateral recess stenosis. Mild to moderate left L3 foraminal
stenosis.

L4-L5: Subtle anterolisthesis with progressed, now moderate to
severe facet and ligament flavum hypertrophy. Degenerative facet
joint fluid. Increased circumferential disc/pseudo disc. Posterior
annular fissure (series 4, image 8) is new or increased. Mild
bilateral lateral recess stenosis has increased (L5 nerve levels).
Borderline spinal stenosis. No foraminal stenosis.

L5-S1: Chronic pronounced disc space loss and circumferential disc
osteophyte complex. Facet hypertrophy is increased, now moderate. No
spinal stenosis. Mild chronic left lateral recess stenosis appears
stable (left S1 nerve level). Stable mild L5 foraminal stenosis,
greater on the left.
IMPRESSION: 1. Progressed L4-L5 disc and posterior element degeneration since
5728, with subtle new anterolisthesis there. Increased mild
bilateral lateral recess stenosis at the descending L5 nerve levels,
and borderline new spinal stenosis.
2. L3-L4 has progressed with up to moderate multifactorial left L3
neural foraminal stenosis.
3. Mild new left L2 foraminal stenosis related to disc bulging.
4. Stable chronic disc and endplate degeneration at L5-S1 with mild
chronic left lateral recess and foraminal stenosis.

## 2022-10-31 ENCOUNTER — Other Ambulatory Visit: Payer: Self-pay | Admitting: Rehabilitation

## 2022-10-31 DIAGNOSIS — S22080A Wedge compression fracture of T11-T12 vertebra, initial encounter for closed fracture: Secondary | ICD-10-CM

## 2022-11-01 ENCOUNTER — Ambulatory Visit
Admission: RE | Admit: 2022-11-01 | Discharge: 2022-11-01 | Disposition: A | Discharge status: Home / Self Care | Payer: Medicare Other | Source: Ambulatory Visit | Attending: Rehabilitation | Admitting: Rehabilitation

## 2022-11-01 DIAGNOSIS — S22080A Wedge compression fracture of T11-T12 vertebra, initial encounter for closed fracture: Secondary | ICD-10-CM

## 2023-04-24 ENCOUNTER — Other Ambulatory Visit: Payer: Self-pay | Admitting: *Deleted

## 2023-04-24 DIAGNOSIS — M479 Spondylosis, unspecified: Secondary | ICD-10-CM

## 2023-05-06 ENCOUNTER — Ambulatory Visit
Admission: RE | Admit: 2023-05-06 | Discharge: 2023-05-06 | Disposition: A | Discharge status: Home / Self Care | Payer: Medicare Other | Source: Ambulatory Visit | Attending: *Deleted | Admitting: *Deleted

## 2023-05-06 DIAGNOSIS — M479 Spondylosis, unspecified: Secondary | ICD-10-CM

## 2024-04-09 NOTE — Therapy (Signed)
 OUTPATIENT PHYSICAL THERAPY VESTIBULAR EVALUATION  Patient Name: Suzanne Bernard MRN: [id number] DOB:10-06-1951, 73 y.o., female Today's Date: 04/10/2024  END OF SESSION:  PT End of Session - 04/10/24 1319     Visit Number 1    Number of Visits 9    Date for PT Re-Evaluation 06/05/24    Authorization Type eval: 04/10/24    PT Start Time 1325    PT Stop Time 1400    PT Time Calculation (min) 35 min    Activity Tolerance Patient tolerated treatment well    Behavior During Therapy [facility] for tasks assessed/performed            History reviewed. No pertinent past medical history. 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 sciatica 11/10/2015   Foot pain 10/01/2015   Acute cystitis without hematuria 05/03/2015   Chronic nonalcoholic liver disease 06/29/2014   Pelvic pain in female 12/23/2013   Anemia 06/13/2013   Chronic constipation 08/16/2011   Esophageal reflux 08/16/2011   Gastroesophageal reflux 08/16/2011   Asthma 05/29/2011   Mixed hyperlipidemia 05/29/2011   Type II diabetes mellitus (HCC) 05/29/2011    PCP: Marion Sicilian, MD  REFERRING PROVIDER: Rogers Clayman, MD   REFERRING DIAG: R42 (ICD-10-CM) - Dizziness and giddiness   RATIONALE FOR EVALUATION AND TREATMENT: Rehabilitation  THERAPY DIAG: Dizziness and giddiness  ONSET DATE: Approximately 2 years ago  FOLLOW-UP APPT SCHEDULED WITH REFERRING PROVIDER: No    SUBJECTIVE:   Chief Complaint: "I've been falling"  Pertinent History Pt arrives with complaints of vertigo for multiple years but worsening recently. Symptoms are intermittent and positional. She saw Dr. Donnie Galea and her history was consistent with BPPV but testing was negative in their office. She is scheduled for a brain MRI and ENT wants to add IAC  contrast and repeat carotid dopplers. Dopplers in 2022 <40% stenosis but also irregular plaque. She is complaining of ongoing balance issues and recently finished PT at [facility] (April). History of L4-S1 PSF TLIF on 08/27/2023 at [facility] with Dr. Nadia Aurora. Also h/o T11 kyphoplasty. She reports that her back pain is worse now compared to before her surgery. She has had multiple falls recently and pt states that surgeon has stated that he thinks her hardware shifted after falls. History of chronic L foot pain.   Description of dizziness: Vertigo Frequency: positional Duration: approximately 1 minute Symptom nature: positional Progression of symptoms since onset: worse History of similar episodes: Yes  Provocative Factors: Rolling onto R side, bending forward, looking upward Easing Factors: waiting for symptoms to pass  Auditory complaints (tinnitus, pain, drainage, hearing loss, aural fullness): Yes, constant R ear ache, possible subjective hearing loss in R ear, no tinnitus, no drainage, no aural fullness; Vision changes (diplopia, visual field loss, recent changes, recent eye exam): No Chest pain/palpitations: No History of head injury/concussion: Yes, multiple episodes of trauma to head with falls; Stress/anxiety: No Migraines/headaches: No Nausea/vomiting: No Numbness/tingling: Yes, N/T in L foot x 20 years; Focal weakness: No Dysarthria/dysphagia/drop attacks: No  Has patient fallen in last 6 months? Yes, Number of falls: 12 Pertinent pain: Yes, neck pain, low back pain, L foot pain Dominant hand: left Imaging: No, scheduled for brain MRI 04/29/24; Prior level of function: Independent Occupational demands: Hobbies:  PRECAUTIONS: None  WEIGHT  BEARING RESTRICTIONS No  LIVING ENVIRONMENT: Lives with: lives with their spouse Lives in: House/apartment Stairs: 3 steps to enter (bilateral narrow rails), multi-level home (bedroom on first);  PATIENT GOALS  Decrease dizziness and improve balance   OBJECTIVE EXAMINATION  POSTURE: No gross deficits contributing to symptoms  NEUROLOGICAL SCREEN: Deferred  CRANIAL NERVES Deferred  COORDINATION Deferred   RANGE OF MOTION Cervical Spine AROM limited in extension, rotation, and lateral flexion. Pain reported at end range in all directions;  MANUAL MUSCLE TESTING BUE/BLE strength WNL without focal deficits  TRANSFERS/GAIT Independent for transfers and ambulation without assistive device   PATIENT SURVEYS ABC: 51.3% DHI: 32/100  OCULOMOTOR / VESTIBULAR TESTING  Oculomotor Exam- Room Light  Findings Comments  Ocular Alignment normal   Ocular ROM normal   Spontaneous Nystagmus normal   Gaze-Holding Nystagmus normal   End-Gaze Nystagmus normal   Vergence (normal 2-3") not examined   Smooth Pursuit normal   Cross-Cover Test not examined   Saccades normal   VOR Cancellation not examined   Left Head Impulse not examined   Right Head Impulse not examined   Static Acuity not examined   Dynamic Acuity not examined    Oculomotor Exam- Fixation Suppressed: Deferred  BPPV TESTS:  Symptoms Duration Intensity Nystagmus  L Dix-Hallpike None   None  R Dix-Hallpike None   None  L Head Roll Vertigo Approximately 45s Severe Upbeating L torsional  R Head Roll Vertigo Approximately 20s Mild Upbeating R torsional  L Sidelying Test      R Sidelying Test      (blank = not tested)  Clinical Test of Sensory Interaction for Balance (CTSIB): Deferred  FUNCTIONAL OUTCOME MEASURES  Results Comments  BERG    DGI    FGA    TUG    5TSTS    6 Minute Walk Test    10 Meter Gait Speed    (blank = not tested)   TODAY'S TREATMENT  Deferred   PATIENT EDUCATION:  Education details: Plan of care, BPPV Person educated: Patient Education method: Explanation, video, and handout Education comprehension: verbalized understanding   HOME EXERCISE PROGRAM:  Access Code: [email or web address] URL:  [email or web address] Date: 04/10/2024 Prepared by: Crawford Dock  Patient Education - BPPV   ASSESSMENT: CLINICAL IMPRESSION: Patient is a 73 y.o. female who was seen today for physical therapy evaluation and treatment for dizziness. 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  OBJECTIVE IMPAIRMENTS: decreased balance and dizziness.   ACTIVITY LIMITATIONS: stairs and caring for others  PARTICIPATION LIMITATIONS: shopping and community activity  PERSONAL FACTORS: Age, Behavior pattern, Past/current experiences, Time since onset of injury/illness/exacerbation, and 1-2 comorbidities: chronic pain and DM are also affecting patient's functional outcome.   REHAB POTENTIAL: Good  CLINICAL DECISION MAKING: Stable/uncomplicated  EVALUATION COMPLEXITY: Low   GOALS:  SHORT TERM GOALS: Target date: 05/08/2024  Pt will be independent with HEP for dizziness in order to decrease symptoms, improve balance,decrease fall risk, and improve function at home. Baseline: Goal status: INITIAL   LONG TERM GOALS: Target date: 06/05/2024  Pt will improve ABC by at least 13% in order to demonstrate clinically significant improvement in balance confidence. Baseline: 51.3% Goal status: INITIAL  2.  Pt will decrease DHI score by at least 18 points in order to demonstrate clinically significant reduction in disability related to dizziness.  Baseline: 32/100 Goal status: INITIAL  3.  Pt  will report at least 75% improvement in dizziness symptoms in order to improve symptom-free function at home and with leisure activities.      Baseline:  Goal status: INITIAL   PLAN: PT FREQUENCY: 1x/week  PT DURATION: 8 weeks  PLANNED INTERVENTIONS: Therapeutic exercises, Therapeutic activity, Neuromuscular re-education, Balance training, Gait training,  Patient/Family education, Self Care, Joint mobilization, Joint manipulation, Vestibular training, Canalith repositioning, Orthotic/Fit training, DME instructions, Dry Needling, Electrical stimulation, Spinal manipulation, Spinal mobilization, Cryotherapy, Moist heat, Taping, Traction, Ultrasound, Ionotophoresis 4mg /ml Dexamethasone, Manual therapy, and Re-evaluation.  PLAN FOR NEXT SESSION: Repeat BPPV testing and treatment as indicated, once clear perform additional fixation suppression and balance testing   Sherill Ding Tennis Mckinnon PT, DPT, GCS  Bobbye Reinitz, PT 04/10/2024, 3:11 PM

## 2024-04-10 ENCOUNTER — Ambulatory Visit: Attending: Otolaryngology

## 2024-04-10 DIAGNOSIS — R42 Dizziness and giddiness: Secondary | ICD-10-CM | POA: Diagnosis present

## 2024-04-16 NOTE — Therapy (Incomplete)
 OUTPATIENT PHYSICAL THERAPY VESTIBULAR TREATMENT  Patient Name: Suzanne Bernard MRN: [id number] DOB:1951-09-09, 73 y.o., female Today's Date: 04/16/2024  END OF SESSION:   No past medical history on file. 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 sciatica 11/10/2015   Foot pain 10/01/2015   Acute cystitis without hematuria 05/03/2015   Chronic nonalcoholic liver disease 06/29/2014   Pelvic pain in female 12/23/2013   Anemia 06/13/2013   Chronic constipation 08/16/2011   Esophageal reflux 08/16/2011   Gastroesophageal reflux 08/16/2011   Asthma 05/29/2011   Mixed hyperlipidemia 05/29/2011   Type II diabetes mellitus (HCC) 05/29/2011   FROM THE INITIAL EVALUATION PCP: Marion Sicilian, MD  REFERRING PROVIDER: Rogers Clayman, MD   REFERRING DIAG: R42 (ICD-10-CM) - Dizziness and giddiness   RATIONALE FOR EVALUATION AND TREATMENT: Rehabilitation  THERAPY DIAG: Dizziness and giddiness  ONSET DATE: Approximately 2 years ago  FOLLOW-UP APPT SCHEDULED WITH REFERRING PROVIDER: No    SUBJECTIVE:   Chief Complaint: "I've been falling"  Pertinent History Pt arrives with complaints of vertigo for multiple years but worsening recently. Symptoms are intermittent and positional. She saw Dr. Donnie Galea and her history was consistent with BPPV but testing was negative in their office. She is scheduled for a brain MRI and ENT wants to add IAC contrast and repeat carotid dopplers. Dopplers in 2022 <40% stenosis but also irregular plaque. She is complaining of ongoing balance issues and recently finished PT at [facility] (April). History of L4-S1 PSF TLIF on 08/27/2023 at [facility] with Dr. Nadia Aurora. Also h/o T11 kyphoplasty. She reports that her back pain is worse now compared to  before her surgery. She has had multiple falls recently and pt states that surgeon has stated that he thinks her hardware shifted after falls. History of chronic L foot pain.   Description of dizziness: Vertigo Frequency: positional Duration: approximately 1 minute Symptom nature: positional Progression of symptoms since onset: worse History of similar episodes: Yes  Provocative Factors: Rolling onto R side, bending forward, looking upward Easing Factors: waiting for symptoms to pass  Auditory complaints (tinnitus, pain, drainage, hearing loss, aural fullness): Yes, constant R ear ache, possible subjective hearing loss in R ear, no tinnitus, no drainage, no aural fullness; Vision changes (diplopia, visual field loss, recent changes, recent eye exam): No Chest pain/palpitations: No History of head injury/concussion: Yes, multiple episodes of trauma to head with falls; Stress/anxiety: No Migraines/headaches: No Nausea/vomiting: No Numbness/tingling: Yes, N/T in L foot x 20 years; Focal weakness: No Dysarthria/dysphagia/drop attacks: No  Has patient fallen in last 6 months? Yes, Number of falls: 12 Pertinent pain: Yes, neck pain, low back pain, L foot pain Dominant hand: left Imaging: No, scheduled for brain MRI 04/29/24; Prior level of function: Independent Occupational demands: Hobbies:  PRECAUTIONS: None  WEIGHT BEARING RESTRICTIONS No  LIVING ENVIRONMENT: Lives with: lives with their spouse Lives in: House/apartment Stairs: 3 steps to enter (bilateral narrow rails), multi-level home (bedroom on first);  PATIENT GOALS Decrease dizziness and improve balance   OBJECTIVE EXAMINATION  POSTURE: No gross deficits contributing to symptoms  NEUROLOGICAL SCREEN: Deferred  CRANIAL NERVES Deferred  COORDINATION Deferred   RANGE OF MOTION Cervical Spine AROM limited in extension, rotation, and lateral flexion. Pain reported at end range in all directions;  MANUAL MUSCLE  TESTING BUE/BLE strength WNL without focal deficits  TRANSFERS/GAIT Independent for transfers and ambulation without assistive device   PATIENT SURVEYS ABC: 51.3% DHI: 32/100  OCULOMOTOR / VESTIBULAR TESTING  Oculomotor Exam- Room Light  Findings Comments  Ocular Alignment normal   Ocular ROM normal   Spontaneous Nystagmus normal   Gaze-Holding Nystagmus normal   End-Gaze Nystagmus normal   Vergence (normal 2-3") not examined   Smooth Pursuit normal   Cross-Cover Test not examined   Saccades normal   VOR Cancellation not examined   Left Head Impulse not examined   Right Head Impulse not examined   Static Acuity not examined   Dynamic Acuity not examined    Oculomotor Exam- Fixation Suppressed: Deferred  BPPV TESTS:  Symptoms Duration Intensity Nystagmus  L Dix-Hallpike None   None  R Dix-Hallpike None   None  L Head Roll Vertigo Approximately 45s Severe Upbeating L torsional  R Head Roll Vertigo Approximately 20s Mild Upbeating R torsional  L Sidelying Test      R Sidelying Test      (blank = not tested)  Clinical Test of Sensory Interaction for Balance (CTSIB): Deferred  FUNCTIONAL OUTCOME MEASURES  Results Comments  BERG    DGI    FGA    TUG    5TSTS    6 Minute Walk Test    10 Meter Gait Speed    (blank = not tested)   TODAY'S TREATMENT    SUBJECTIVE: Pt reports that she is doing well today. No changes since the initial evaluation. Denies pertinent pain. No specific questions or concerns.   PAIN: Denies  Repeat BPPV testing and treatment as indicated, once clear perform additional fixation suppression and balance testing  BPPV TESTS:  Symptoms Duration Intensity Nystagmus  L Dix-Hallpike None   None  R Dix-Hallpike None   None  L Head Roll Vertigo Approximately 45s Severe Upbeating L torsional  R Head Roll Vertigo Approximately 20s Mild Upbeating R torsional  L Sidelying Test      R Sidelying Test      (blank = not tested)  Neuromuscular  Re-education   Manual Therapy     PATIENT EDUCATION:  Education details: Plan of care, BPPV Person educated: Patient Education method: Explanation, video, and handout Education comprehension: verbalized understanding   HOME EXERCISE PROGRAM:  Access Code: [email or web address] URL: [email or web address] Date: 04/10/2024 Prepared by: Crawford Dock  Patient Education - BPPV   ASSESSMENT: CLINICAL IMPRESSION: Patient is a 73 y.o. female who was seen today for physical therapy evaluation and treatment for dizziness. 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  OBJECTIVE IMPAIRMENTS: decreased balance and dizziness.   ACTIVITY LIMITATIONS: stairs and caring for others  PARTICIPATION LIMITATIONS: shopping and community activity  PERSONAL FACTORS: Age, Behavior pattern, Past/current experiences, Time since onset of injury/illness/exacerbation, and 1-2 comorbidities: chronic pain and DM are also affecting patient's functional outcome.   REHAB POTENTIAL: Good  CLINICAL DECISION MAKING: Stable/uncomplicated  EVALUATION COMPLEXITY: Low   GOALS:  SHORT TERM GOALS: Target date: 05/08/2024  Pt will be independent with HEP for dizziness in order to decrease symptoms, improve balance,decrease fall risk, and improve function at home. Baseline: Goal status: INITIAL   LONG TERM GOALS: Target date: 06/05/2024  Pt will improve ABC by at least 13% in order to demonstrate clinically significant improvement in balance confidence. Baseline: 51.3% Goal status: INITIAL  2.  Pt will decrease DHI score by at least 18 points in order to demonstrate clinically significant reduction in disability related to dizziness.  Baseline: 32/100 Goal status: INITIAL  3.  Pt will report at least 75% improvement in dizziness symptoms in order to improve  symptom-free function at home and with leisure activities.      Baseline:  Goal status: INITIAL   PLAN: PT FREQUENCY: 1x/week  PT DURATION: 8 weeks  PLANNED INTERVENTIONS: Therapeutic exercises, Therapeutic activity, Neuromuscular re-education, Balance training, Gait training, Patient/Family education, Self Care, Joint mobilization, Joint manipulation, Vestibular training, Canalith repositioning, Orthotic/Fit training, DME instructions, Dry Needling, Electrical stimulation, Spinal manipulation, Spinal mobilization, Cryotherapy, Moist heat, Taping, Traction, Ultrasound, Ionotophoresis 4mg /ml Dexamethasone, Manual therapy, and Re-evaluation.  PLAN FOR NEXT SESSION: Repeat BPPV testing and treatment as indicated, once clear perform additional fixation suppression and balance testing   Sherill Ding Delina Kruczek PT, DPT, GCS  Dim Meisinger, PT 04/16/2024, 1:02 PM

## 2024-04-17 ENCOUNTER — Ambulatory Visit

## 2024-04-17 DIAGNOSIS — R42 Dizziness and giddiness: Secondary | ICD-10-CM

## 2024-04-24 ENCOUNTER — Ambulatory Visit: Attending: Otolaryngology

## 2024-04-24 DIAGNOSIS — R42 Dizziness and giddiness: Secondary | ICD-10-CM | POA: Insufficient documentation

## 2024-04-24 NOTE — Therapy (Signed)
 OUTPATIENT PHYSICAL THERAPY VESTIBULAR TREATMENT  Patient Name: Suzanne Bernard MRN: [id number] DOB:[birth year], 73 y.o., female Today's Date: 04/24/2024  END OF SESSION:  PT End of Session - 04/24/24 1446     Visit Number 2    Number of Visits 9    Date for PT Re-Evaluation 06/05/24    Authorization Type eval: 04/10/24    PT Start Time 1447    PT Stop Time 1530    PT Time Calculation (min) 43 min    Activity Tolerance Patient tolerated treatment well    Behavior During Therapy [facility] for tasks assessed/performed            History reviewed. No pertinent past medical history. 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 sciatica 11/10/2015   Foot pain 10/01/2015   Acute cystitis without hematuria 05/03/2015   Chronic nonalcoholic liver disease 06/29/2014   Pelvic pain in female 12/23/2013   Anemia 06/13/2013   Chronic constipation 08/16/2011   Esophageal reflux 08/16/2011   Gastroesophageal reflux 08/16/2011   Asthma 05/29/2011   Mixed hyperlipidemia 05/29/2011   Type II diabetes mellitus (HCC) 05/29/2011   FROM THE INITIAL EVALUATION PCP: Marion Sicilian, MD  REFERRING PROVIDER: Rogers Clayman, MD   REFERRING DIAG: R42 (ICD-10-CM) - Dizziness and giddiness   RATIONALE FOR EVALUATION AND TREATMENT: Rehabilitation  THERAPY DIAG: Dizziness and giddiness  ONSET DATE: Approximately 2 years ago  FOLLOW-UP APPT SCHEDULED WITH REFERRING PROVIDER: No    SUBJECTIVE:   Chief Complaint: "I've been falling"  Pertinent History Pt arrives with complaints of vertigo for multiple years but worsening recently. Symptoms are intermittent and positional. She saw Dr. Donnie Galea and her history was consistent with BPPV but testing was negative in their office. She is scheduled for a brain MRI  and ENT wants to add IAC contrast and repeat carotid dopplers. Dopplers in 2022 <40% stenosis but also irregular plaque. She is complaining of ongoing balance issues and recently finished PT at [facility] (April). History of L4-S1 PSF TLIF on 08/27/2023 at [facility] with Dr. Nadia Aurora. Also h/o T11 kyphoplasty. She reports that her back pain is worse now compared to before her surgery. She has had multiple falls recently and pt states that surgeon has stated that he thinks her hardware shifted after falls. History of chronic L foot pain.   Description of dizziness: Vertigo Frequency: positional Duration: approximately 1 minute Symptom nature: positional Progression of symptoms since onset: worse History of similar episodes: Yes  Provocative Factors: Rolling onto R side, bending forward, looking upward Easing Factors: waiting for symptoms to pass  Auditory complaints (tinnitus, pain, drainage, hearing loss, aural fullness): Yes, constant R ear ache, possible subjective hearing loss in R ear, no tinnitus, no drainage, no aural fullness; Vision changes (diplopia, visual field loss, recent changes, recent eye exam): No Chest pain/palpitations: No History of head injury/concussion: Yes, multiple episodes of trauma to head with falls; Stress/anxiety: No Migraines/headaches: No Nausea/vomiting: No Numbness/tingling: Yes, N/T in L foot x 20 years; Focal weakness: No Dysarthria/dysphagia/drop attacks: No  Has patient fallen in last 6 months? Yes, Number of falls: 12 Pertinent pain: Yes, neck pain, low back pain, L foot pain Dominant hand: left Imaging: No, scheduled for brain MRI 04/29/24; Prior level of function: Independent Occupational demands: Hobbies:  PRECAUTIONS:  None  WEIGHT BEARING RESTRICTIONS No  LIVING ENVIRONMENT: Lives with: lives with their spouse Lives in: House/apartment Stairs: 3 steps to enter (bilateral narrow rails), multi-level home (bedroom on  first);  PATIENT GOALS Decrease dizziness and improve balance   OBJECTIVE EXAMINATION  POSTURE: No gross deficits contributing to symptoms  NEUROLOGICAL SCREEN: Deferred  CRANIAL NERVES Deferred  COORDINATION Deferred   RANGE OF MOTION Cervical Spine AROM limited in extension, rotation, and lateral flexion. Pain reported at end range in all directions;  MANUAL MUSCLE TESTING BUE/BLE strength WNL without focal deficits  TRANSFERS/GAIT Independent for transfers and ambulation without assistive device   PATIENT SURVEYS ABC: 51.3% DHI: 32/100  OCULOMOTOR / VESTIBULAR TESTING  Oculomotor Exam- Room Light  Findings Comments  Ocular Alignment normal   Ocular ROM normal   Spontaneous Nystagmus normal   Gaze-Holding Nystagmus normal   End-Gaze Nystagmus normal   Vergence (normal 2-3") not examined   Smooth Pursuit normal   Cross-Cover Test not examined   Saccades normal   VOR Cancellation not examined   Left Head Impulse not examined   Right Head Impulse not examined   Static Acuity not examined   Dynamic Acuity not examined    Oculomotor Exam- Fixation Suppressed: Deferred  BPPV TESTS:  Symptoms Duration Intensity Nystagmus  L Dix-Hallpike None   None  R Dix-Hallpike None   None  L Head Roll Vertigo Approximately 45s Severe Upbeating L torsional  R Head Roll Vertigo Approximately 20s Mild Upbeating R torsional  L Sidelying Test      R Sidelying Test      (blank = not tested)  Clinical Test of Sensory Interaction for Balance (CTSIB): Deferred  FUNCTIONAL OUTCOME MEASURES  Results Comments  BERG    DGI    FGA    TUG    5TSTS    6 Minute Walk Test    10 Meter Gait Speed    (blank = not tested)   TODAY'S TREATMENT    SUBJECTIVE: Pt reports no changes since the initial evaluation. Ongoing positional vertigo. Chronic neck pain. No specific questions.  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 One minute holds in each position and retesting between maneuvers. After first maneuver during retesting in the R Dix-Hallpike position pt remains asymptomatic but nystagmus appears to be pure R horizontal (geotrophic). Completed second Epley Maneuver however afterward proceeded to perform Gufoni maneuver (L sidelying) for possible R horizontal canalithiasis. Thirty second hold time in first position (sidelying) followed by 1 minute hold time in second position (nose rotated toward the floor). Performed Roll Test and pt starts with vigorous R torsional nystagmus with concurrent vertigo lasting >1 minute. Performed R Liberatory Maneuver with 1 minutes holds in each position. Roll Test afterward remains positive on the R for upbeating R torsional nystagmus with concurrent vertigo.   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 One minute holds in each position and retesting between maneuvers. After first maneuver during retesting in the R Dix-Hallpike position pt remains asymptomatic but nystagmus appears to be pure R horizontal (geotrophic). Completed second Epley Maneuver however afterward proceeded to perform Gufoni maneuver (L sidelying) for possible R horizontal canalithiasis.  Thirty second hold time in first position (sidelying) followed by 1 minute hold time in second position (nose rotated toward the floor). Performed Roll Test and pt starts with vigorous R torsional nystagmus with concurrent vertigo lasting >1 minute. Performed R Liberatory Maneuver with 1 minutes holds in each position. Roll Test afterward remains positive on the R for upbeating R torsional nystagmus with concurrent vertigo. Plan to retest and treat for BPPV at future sessions. She will benefit from PT services to address deficits in dizziness and balance in order to improve function at home and decrease her fall risk.  OBJECTIVE IMPAIRMENTS: decreased balance and dizziness.   ACTIVITY LIMITATIONS: stairs and caring for others  PARTICIPATION LIMITATIONS: shopping and community activity  PERSONAL FACTORS: Age, Behavior pattern, Past/current experiences, Time since onset of injury/illness/exacerbation, and 1-2 comorbidities: chronic pain and DM are also affecting patient's functional outcome.   REHAB POTENTIAL: Good  CLINICAL DECISION MAKING: Stable/uncomplicated  EVALUATION COMPLEXITY: Low   GOALS:  SHORT TERM GOALS: Target date: 05/08/2024  Pt will be independent with HEP for dizziness in order to decrease symptoms, improve balance,decrease fall risk, and improve function at home. Baseline: Goal status: INITIAL   LONG TERM GOALS: Target date: 06/05/2024  Pt will improve ABC by at least 13% in order to demonstrate clinically significant improvement in balance confidence. Baseline: 51.3% Goal status: INITIAL  2.  Pt will decrease DHI score by at least 18 points in order to demonstrate clinically significant reduction in disability related to dizziness.  Baseline: 32/100 Goal status: INITIAL  3.  Pt will report at least 75% improvement in dizziness symptoms in order to improve symptom-free function at home and with leisure activities.      Baseline:  Goal status:  INITIAL   PLAN: PT FREQUENCY: 1x/week  PT DURATION: 8 weeks  PLANNED INTERVENTIONS: Therapeutic exercises, Therapeutic activity, Neuromuscular re-education, Balance training, Gait training, Patient/Family education, Self Care, Joint mobilization, Joint manipulation, Vestibular training, Canalith repositioning, Orthotic/Fit training, DME instructions, Dry Needling, Electrical stimulation, Spinal manipulation, Spinal mobilization, Cryotherapy, Moist heat, Taping, Traction, Ultrasound, Ionotophoresis 4mg /ml Dexamethasone, Manual therapy, and Re-evaluation.  PLAN FOR NEXT SESSION: Repeat BPPV testing and treatment as indicated, once clear perform additional fixation suppression and balance testing   Sherill Ding Kazaria Gaertner PT, DPT, GCS  Horton Ellithorpe, PT 04/24/2024, 3:32 PM

## 2024-04-29 ENCOUNTER — Ambulatory Visit

## 2024-04-29 DIAGNOSIS — R42 Dizziness and giddiness: Secondary | ICD-10-CM

## 2024-04-29 NOTE — Therapy (Signed)
 OUTPATIENT PHYSICAL THERAPY VESTIBULAR TREATMENT  Patient Name: Suzanne Bernard MRN: [id number] DOB:[birth year], 73 y.o., female Today's Date: 04/29/2024  END OF SESSION:  PT End of Session - 04/29/24 0849     Visit Number 3    Number of Visits 9    Date for PT Re-Evaluation 06/05/24    Authorization Type eval: 04/10/24    PT Start Time 0850    PT Stop Time 0930    PT Time Calculation (min) 40 min    Activity Tolerance Patient tolerated treatment well    Behavior During Therapy [facility] for tasks assessed/performed            History reviewed. No pertinent past medical history. 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 sciatica 11/10/2015   Foot pain 10/01/2015   Acute cystitis without hematuria 05/03/2015   Chronic nonalcoholic liver disease 06/29/2014   Pelvic pain in female 12/23/2013   Anemia 06/13/2013   Chronic constipation 08/16/2011   Esophageal reflux 08/16/2011   Gastroesophageal reflux 08/16/2011   Asthma 05/29/2011   Mixed hyperlipidemia 05/29/2011   Type II diabetes mellitus (HCC) 05/29/2011   FROM THE INITIAL EVALUATION PCP: Marion Sicilian, MD  REFERRING PROVIDER: Rogers Clayman, MD   REFERRING DIAG: R42 (ICD-10-CM) - Dizziness and giddiness   RATIONALE FOR EVALUATION AND TREATMENT: Rehabilitation  THERAPY DIAG: Dizziness and giddiness  ONSET DATE: Approximately 2 years ago  FOLLOW-UP APPT SCHEDULED WITH REFERRING PROVIDER: No    SUBJECTIVE:   Chief Complaint: "I've been falling"  Pertinent History Pt arrives with complaints of vertigo for multiple years but worsening recently. Symptoms are intermittent and positional. She saw Dr. Donnie Galea and her history was consistent with BPPV but testing was negative in their office. She is scheduled for a brain MRI  and ENT wants to add IAC contrast and repeat carotid dopplers. Dopplers in 2022 <40% stenosis but also irregular plaque. She is complaining of ongoing balance issues and recently finished PT at [facility] (April). History of L4-S1 PSF TLIF on 08/27/2023 at [facility] with Dr. Nadia Aurora. Also h/o T11 kyphoplasty. She reports that her back pain is worse now compared to before her surgery. She has had multiple falls recently and pt states that surgeon has stated that he thinks her hardware shifted after falls. History of chronic L foot pain.   Description of dizziness: Vertigo Frequency: positional Duration: approximately 1 minute Symptom nature: positional Progression of symptoms since onset: worse History of similar episodes: Yes  Provocative Factors: Rolling onto R side, bending forward, looking upward Easing Factors: waiting for symptoms to pass  Auditory complaints (tinnitus, pain, drainage, hearing loss, aural fullness): Yes, constant R ear ache, possible subjective hearing loss in R ear, no tinnitus, no drainage, no aural fullness; Vision changes (diplopia, visual field loss, recent changes, recent eye exam): No Chest pain/palpitations: No History of head injury/concussion: Yes, multiple episodes of trauma to head with falls; Stress/anxiety: No Migraines/headaches: No Nausea/vomiting: No Numbness/tingling: Yes, N/T in L foot x 20 years; Focal weakness: No Dysarthria/dysphagia/drop attacks: No  Has patient fallen in last 6 months? Yes, Number of falls: 12 Pertinent pain: Yes, neck pain, low back pain, L foot pain Dominant hand: left Imaging: No, scheduled for brain MRI 04/29/24; Prior level of function: Independent Occupational demands: Hobbies:  PRECAUTIONS:  None  WEIGHT BEARING RESTRICTIONS No  LIVING ENVIRONMENT: Lives with: lives with their spouse Lives in: House/apartment Stairs: 3 steps to enter (bilateral narrow rails), multi-level home (bedroom on  first);  PATIENT GOALS Decrease dizziness and improve balance   OBJECTIVE EXAMINATION  POSTURE: No gross deficits contributing to symptoms  NEUROLOGICAL SCREEN: Deferred  CRANIAL NERVES Deferred  COORDINATION Deferred   RANGE OF MOTION Cervical Spine AROM limited in extension, rotation, and lateral flexion. Pain reported at end range in all directions;  MANUAL MUSCLE TESTING BUE/BLE strength WNL without focal deficits  TRANSFERS/GAIT Independent for transfers and ambulation without assistive device   PATIENT SURVEYS ABC: 51.3% DHI: 32/100  OCULOMOTOR / VESTIBULAR TESTING  Oculomotor Exam- Room Light  Findings Comments  Ocular Alignment normal   Ocular ROM normal   Spontaneous Nystagmus normal   Gaze-Holding Nystagmus normal   End-Gaze Nystagmus normal   Vergence (normal 2-3") not examined   Smooth Pursuit normal   Cross-Cover Test not examined   Saccades normal   VOR Cancellation not examined   Left Head Impulse not examined   Right Head Impulse not examined   Static Acuity not examined   Dynamic Acuity not examined    Oculomotor Exam- Fixation Suppressed: Deferred  BPPV TESTS:  Symptoms Duration Intensity Nystagmus  L Dix-Hallpike None   None  R Dix-Hallpike None   None  L Head Roll Vertigo Approximately 45s Severe Upbeating L torsional  R Head Roll Vertigo Approximately 20s Mild Upbeating R torsional  L Sidelying Test      R Sidelying Test      (blank = not tested)  Clinical Test of Sensory Interaction for Balance (CTSIB): Deferred  FUNCTIONAL OUTCOME MEASURES  Results Comments  BERG    DGI    FGA    TUG    5TSTS    6 Minute Walk Test    10 Meter Gait Speed    (blank = not tested)   TODAY'S TREATMENT    SUBJECTIVE: Pt reports ongoing positional vertigo after the last therapy session but no episodes of vertigo since Saturday. She continues with chronic neck pain. She also continues to complain of unsteadiness/dizziness with quick  turns.   PAIN: Chronic neck pain;   Neuromuscular Re-education   BPPV TESTS:  Symptoms Duration Intensity Nystagmus  L Dix-Hallpike None   None  R Dix-Hallpike None   None  L Head Roll None   None  R Head Roll None   None  Bow None   None  Lean None   None  (blank = not tested)   OCULOMOTOR / VESTIBULAR TESTING  Oculomotor Exam- Room Light  Findings Comments  Ocular Alignment normal   Ocular ROM normal   Spontaneous Nystagmus normal   Gaze-Holding Nystagmus normal   End-Gaze Nystagmus normal   Vergence (normal 2-3") not examined   Smooth Pursuit abnormal   Cross-Cover Test not examined   Saccades abnormal Consistently hypometric horizontal and vertical, slow in the vertical plane  VOR Cancellation not examined   Left Head Impulse not examined   Right Head Impulse not examined   Static Acuity not examined   Dynamic Acuity not examined    Oculomotor Exam- Fixation Suppressed  Findings Comments  Ocular Alignment normal   Spontaneous Nystagmus abnormal 2nd degree pure L horizontal beating nystagmus  Gaze-Holding Nystagmus abnormal Pure L horizontal beating nystagmus with L gaze, absent with R gaze  End-Gaze Nystagmus not examined   Head Shaking Nystagmus not examined   Pressure-Induced  Nystagmus normal   Hyperventilation Induced Nystagmus normal   Skull Vibration Induced Nystagmus abnormal Pure R horizontal beating nystagmus with vibration on both R and L mastoid;   Performed education with pt about BPPV, UVH, and plan of care moving forward;   PATIENT EDUCATION:  Education details: Education about BPPV, UVH, and plan of care; Person educated: Patient Education method: Explanation Education comprehension: verbalized understanding   HOME EXERCISE PROGRAM:  Access Code: [email or web address] URL: [email or web address] Date: 04/10/2024 Prepared by: Crawford Dock  Patient Education - BPPV   ASSESSMENT: CLINICAL IMPRESSION: All BPPV testing negative on  this date. Performed oculomotor/vestibular testing both with and without fixation suppression. Smooth pursuit is saccadic and horizontal/vertical saccades are consistently hypometric and slow in the vertical direction. Second degree pure L horizontal beating nystagmus noted with fixation suppression. Positive skull vibration-induced nystagmus. Findings suggestive of R UVH. Unfortunately, due to chronic neck pain and limitations in cervical ROM, gaze stabilization exercises are not a great intervention for patient. Plan to retest and treat for BPPV at future sessions if necessary but exam today is suggestive of resolution. She will benefit from PT services to address deficits in dizziness and balance in order to improve function at home and decrease her fall risk.  OBJECTIVE IMPAIRMENTS: decreased balance and dizziness.   ACTIVITY LIMITATIONS: stairs and caring for others  PARTICIPATION LIMITATIONS: shopping and community activity  PERSONAL FACTORS: Age, Behavior pattern, Past/current experiences, Time since onset of injury/illness/exacerbation, and 1-2 comorbidities: chronic pain and DM are also affecting patient's functional outcome.   REHAB POTENTIAL: Good  CLINICAL DECISION MAKING: Stable/uncomplicated  EVALUATION COMPLEXITY: Low   GOALS:  SHORT TERM GOALS: Target date: 05/08/2024  Pt will be independent with HEP for dizziness in order to decrease symptoms, improve balance,decrease fall risk, and improve function at home. Baseline: Goal status: INITIAL   LONG TERM GOALS: Target date: 06/05/2024  Pt will improve ABC by at least 13% in order to demonstrate clinically significant improvement in balance confidence. Baseline: 51.3% Goal status: INITIAL  2.  Pt will decrease DHI score by at least 18 points in order to demonstrate clinically significant reduction in disability related to dizziness.  Baseline: 32/100 Goal status: INITIAL  3.  Pt will report at least 75% improvement in  dizziness symptoms in order to improve symptom-free function at home and with leisure activities.      Baseline:  Goal status: INITIAL   PLAN: PT FREQUENCY: 1x/week  PT DURATION: 8 weeks  PLANNED INTERVENTIONS: Therapeutic exercises, Therapeutic activity, Neuromuscular re-education, Balance training, Gait training, Patient/Family education, Self Care, Joint mobilization, Joint manipulation, Vestibular training, Canalith repositioning, Orthotic/Fit training, DME instructions, Dry Needling, Electrical stimulation, Spinal manipulation, Spinal mobilization, Cryotherapy, Moist heat, Taping, Traction, Ultrasound, Ionotophoresis 4mg /ml Dexamethasone, Manual therapy, and Re-evaluation.  PLAN FOR NEXT SESSION: Repeat BPPV testing and treatment as indicated, balance testing;   Sherill Ding Kamira Mellette PT, DPT, GCS  Charnae Lill, PT 04/29/2024, 10:44 AM

## 2024-05-01 ENCOUNTER — Encounter

## 2024-05-08 ENCOUNTER — Ambulatory Visit

## 2024-05-08 DIAGNOSIS — R42 Dizziness and giddiness: Secondary | ICD-10-CM

## 2024-05-08 NOTE — Therapy (Unsigned)
 OUTPATIENT PHYSICAL THERAPY VESTIBULAR TREATMENT  Patient Name: Suzanne Bernard MRN: [id number] DOB:04-Mar-1951, 73 y.o., female Today's Date: 05/09/2024  END OF SESSION:  PT End of Session - 05/08/24 1448     Visit Number 4    Number of Visits 9    Date for PT Re-Evaluation 06/05/24    Authorization Type eval: 04/10/24    PT Start Time 1450    PT Stop Time 1530    PT Time Calculation (min) 40 min    Activity Tolerance Patient tolerated treatment well    Behavior During Therapy [facility] for tasks assessed/performed          History reviewed. No pertinent past medical history. 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 sciatica 11/10/2015   Foot pain 10/01/2015   Acute cystitis without hematuria 05/03/2015   Chronic nonalcoholic liver disease 06/29/2014   Pelvic pain in female 12/23/2013   Anemia 06/13/2013   Chronic constipation 08/16/2011   Esophageal reflux 08/16/2011   Gastroesophageal reflux 08/16/2011   Asthma 05/29/2011   Mixed hyperlipidemia 05/29/2011   Type II diabetes mellitus (HCC) 05/29/2011   FROM THE INITIAL EVALUATION PCP: Marion Sicilian, MD  REFERRING PROVIDER: Rogers Clayman, MD   REFERRING DIAG: R42 (ICD-10-CM) - Dizziness and giddiness   RATIONALE FOR EVALUATION AND TREATMENT: Rehabilitation  THERAPY DIAG: Dizziness and giddiness  ONSET DATE: Approximately 2 years ago  FOLLOW-UP APPT SCHEDULED WITH REFERRING PROVIDER: No    SUBJECTIVE:   Chief Complaint: I've been falling  Pertinent History Pt arrives with complaints of vertigo for multiple years but worsening recently. Symptoms are intermittent and positional. She saw Dr. Donnie Galea and her history was consistent with BPPV but testing was negative in their office. She is scheduled for a brain MRI  and ENT wants to add IAC contrast and repeat carotid dopplers. Dopplers in 2022 <40% stenosis but also irregular plaque. She is complaining of ongoing balance issues and recently finished PT at [facility] (April). History of L4-S1 PSF TLIF on 08/27/2023 at [facility] with Dr. Nadia Aurora. Also h/o T11 kyphoplasty. She reports that her back pain is worse now compared to before her surgery. She has had multiple falls recently and pt states that surgeon has stated that he thinks her hardware shifted after falls. History of chronic L foot pain.   Description of dizziness: Vertigo Frequency: positional Duration: approximately 1 minute Symptom nature: positional Progression of symptoms since onset: worse History of similar episodes: Yes  Provocative Factors: Rolling onto R side, bending forward, looking upward Easing Factors: waiting for symptoms to pass  Auditory complaints (tinnitus, pain, drainage, hearing loss, aural fullness): Yes, constant R ear ache, possible subjective hearing loss in R ear, no tinnitus, no drainage, no aural fullness; Vision changes (diplopia, visual field loss, recent changes, recent eye exam): No Chest pain/palpitations: No History of head injury/concussion: Yes, multiple episodes of trauma to head with falls; Stress/anxiety: No Migraines/headaches: No Nausea/vomiting: No Numbness/tingling: Yes, N/T in L foot x 20 years; Focal weakness: No Dysarthria/dysphagia/drop attacks: No  Has patient fallen in last 6 months? Yes, Number of falls: 12 Pertinent pain: Yes, neck pain, low back pain, L foot pain Dominant hand: left Imaging: No, scheduled for brain MRI 04/29/24; Prior level of function: Independent Occupational demands: Hobbies:  PRECAUTIONS: None  WEIGHT BEARING RESTRICTIONS No  LIVING ENVIRONMENT: Lives with: lives with their spouse Lives in: House/apartment Stairs: 3 steps to enter (bilateral narrow rails), multi-level home (bedroom on  first);  PATIENT GOALS Decrease dizziness and improve balance   OBJECTIVE EXAMINATION  POSTURE: No gross deficits contributing to symptoms  NEUROLOGICAL SCREEN: Deferred  CRANIAL NERVES Deferred  COORDINATION Deferred   RANGE OF MOTION Cervical Spine AROM limited in extension, rotation, and lateral flexion. Pain reported at end range in all directions;  MANUAL MUSCLE TESTING BUE/BLE strength WNL without focal deficits  TRANSFERS/GAIT Independent for transfers and ambulation without assistive device   PATIENT SURVEYS ABC: 51.3% DHI: 32/100  OCULOMOTOR / VESTIBULAR TESTING  Oculomotor Exam- Room Light  Findings Comments  Ocular Alignment normal   Ocular ROM normal   Spontaneous Nystagmus normal   Gaze-Holding Nystagmus normal   End-Gaze Nystagmus normal   Vergence (normal 2-3) not examined   Smooth Pursuit normal   Cross-Cover Test not examined   Saccades normal   VOR Cancellation not examined   Left Head Impulse not examined   Right Head Impulse not examined   Static Acuity not examined   Dynamic Acuity not examined    Oculomotor Exam- Fixation Suppressed  Findings Comments  Ocular Alignment normal   Spontaneous Nystagmus abnormal 2nd degree pure L horizontal beating nystagmus  Gaze-Holding Nystagmus abnormal Pure L horizontal beating nystagmus with L gaze, absent with R gaze  End-Gaze Nystagmus not examined   Head Shaking Nystagmus not examined   Pressure-Induced Nystagmus normal   Hyperventilation Induced Nystagmus normal   Skull Vibration Induced Nystagmus abnormal Pure R horizontal beating nystagmus with vibration on both R and L mastoid;    BPPV TESTS:  Symptoms Duration Intensity Nystagmus  L Dix-Hallpike None   None  R Dix-Hallpike None   None  L Head Roll Vertigo Approximately 45s Severe Upbeating L torsional  R Head Roll Vertigo Approximately 20s Mild Upbeating R torsional  L Sidelying Test      R Sidelying Test      (blank = not  tested)  Clinical Test of Sensory Interaction for Balance (CTSIB): Deferred  FUNCTIONAL OUTCOME MEASURES  Results Comments  BERG    DGI    FGA    TUG    5TSTS    6 Minute Walk Test    10 Meter Gait Speed    (blank = not tested)   TODAY'S TREATMENT    SUBJECTIVE: Pt reports no further vertigo since the last therapy session. However she does report continuing with self-limiting positions to avoid triggering symptoms She continues with chronic neck pain.   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 One minute holds in each position and retesting between maneuvers. After first maneuver during retesting in the L Dix-Hallpike position pt reports no vertigo but nystagmus remains upbeating L torsional. Completed second Epley Maneuver however afterward proceeded to perform L Liberatory Maneuver with one minute holds in each position.     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 One minute holds in each position and retesting between maneuvers. After first maneuver during retesting in the L Dix-Hallpike position pt reports no vertigo but nystagmus remains upbeating L torsional. Completed second Epley Maneuver however afterward proceeded  to perform L Liberatory Maneuver with one minute holds in each position. Findings from prior session suggestive of R UVH. Unfortunately, due to chronic neck pain and limitations in cervical ROM, gaze stabilization exercises are not a great intervention for patient. Plan to retest and treat for BPPV at future sessions and treat as necessary. She will benefit from PT services to address deficits in dizziness and balance in order to improve function at home and decrease her fall risk.  OBJECTIVE IMPAIRMENTS: decreased balance and dizziness.   ACTIVITY LIMITATIONS: stairs and caring for others  PARTICIPATION LIMITATIONS: shopping and community activity  PERSONAL FACTORS: Age, Behavior pattern, Past/current experiences, Time since onset of injury/illness/exacerbation, and 1-2 comorbidities: chronic pain and DM are also affecting patient's functional outcome.   REHAB POTENTIAL: Good  CLINICAL DECISION MAKING: Stable/uncomplicated  EVALUATION COMPLEXITY: Low   GOALS:  SHORT TERM GOALS: Target date: 05/08/2024  Pt will be independent with HEP for dizziness in order to decrease symptoms, improve balance,decrease fall risk, and improve function at home. Baseline: Goal status: INITIAL   LONG TERM GOALS: Target date: 06/05/2024  Pt will improve ABC by at least 13% in order to demonstrate clinically significant improvement in balance confidence. Baseline: 51.3% Goal status: INITIAL  2.  Pt will decrease DHI score by at least 18 points in order to demonstrate clinically significant reduction in disability related to dizziness.  Baseline: 32/100 Goal status: INITIAL  3.  Pt will report at least 75% improvement in dizziness symptoms in order to improve symptom-free function at home and with leisure activities.      Baseline:  Goal status: INITIAL   PLAN: PT FREQUENCY: 1x/week  PT DURATION: 8 weeks  PLANNED INTERVENTIONS: Therapeutic exercises, Therapeutic activity, Neuromuscular  re-education, Balance training, Gait training, Patient/Family education, Self Care, Joint mobilization, Joint manipulation, Vestibular training, Canalith repositioning, Orthotic/Fit training, DME instructions, Dry Needling, Electrical stimulation, Spinal manipulation, Spinal mobilization, Cryotherapy, Moist heat, Taping, Traction, Ultrasound, Ionotophoresis 4mg /ml Dexamethasone, Manual therapy, and Re-evaluation.  PLAN FOR NEXT SESSION: Repeat BPPV testing and treatment as indicated, balance testing;   Sherill Ding Dianara Smullen PT, DPT, GCS  Alaya Iverson, PT 05/09/2024, 9:57 AM

## 2024-05-15 ENCOUNTER — Ambulatory Visit

## 2024-05-15 DIAGNOSIS — R42 Dizziness and giddiness: Secondary | ICD-10-CM

## 2024-05-15 NOTE — Therapy (Signed)
 OUTPATIENT PHYSICAL THERAPY VESTIBULAR TREATMENT  Patient Name: Suzanne Bernard MRN: [id number] DOB:1951/01/26, 73 y.o., female Today's Date: 05/15/2024  END OF SESSION:  PT End of Session - 05/15/24 1449     Visit Number 5    Number of Visits 9    Date for PT Re-Evaluation 06/05/24    Authorization Type eval: 04/10/24    PT Start Time 1450    PT Stop Time 1515    PT Time Calculation (min) 25 min    Activity Tolerance Patient tolerated treatment well    Behavior During Therapy [facility] for tasks assessed/performed         History reviewed. No pertinent past medical history. 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 sciatica 11/10/2015   Foot pain 10/01/2015   Acute cystitis without hematuria 05/03/2015   Chronic nonalcoholic liver disease 06/29/2014   Pelvic pain in female 12/23/2013   Anemia 06/13/2013   Chronic constipation 08/16/2011   Esophageal reflux 08/16/2011   Gastroesophageal reflux 08/16/2011   Asthma 05/29/2011   Mixed hyperlipidemia 05/29/2011   Type II diabetes mellitus (HCC) 05/29/2011   FROM THE INITIAL EVALUATION PCP: Salli Rand, MD  REFERRING PROVIDER: Milissa Hamming, MD   REFERRING DIAG: R42 (ICD-10-CM) - Dizziness and giddiness   RATIONALE FOR EVALUATION AND TREATMENT: Rehabilitation  THERAPY DIAG: Dizziness and giddiness  ONSET DATE: Approximately 2 years ago  FOLLOW-UP APPT SCHEDULED WITH REFERRING PROVIDER: No    SUBJECTIVE:   Chief Complaint: I've been falling  Pertinent History Pt arrives with complaints of vertigo for multiple years but worsening recently. Symptoms are intermittent and positional. She saw Dr. Milissa and her history was consistent with BPPV but testing was negative in their office. She is scheduled for a brain MRI and  ENT wants to add IAC contrast and repeat carotid dopplers. Dopplers in 2022 <40% stenosis but also irregular plaque. She is complaining of ongoing balance issues and recently finished PT at [facility] (April). History of L4-S1 PSF TLIF on 08/27/2023 at [facility] with Dr. Marlyce. Also h/o T11 kyphoplasty. She reports that her back pain is worse now compared to before her surgery. She has had multiple falls recently and pt states that surgeon has stated that he thinks her hardware shifted after falls. History of chronic L foot pain.   Description of dizziness: Vertigo Frequency: positional Duration: approximately 1 minute Symptom nature: positional Progression of symptoms since onset: worse History of similar episodes: Yes  Provocative Factors: Rolling onto R side, bending forward, looking upward Easing Factors: waiting for symptoms to pass  Auditory complaints (tinnitus, pain, drainage, hearing loss, aural fullness): Yes, constant R ear ache, possible subjective hearing loss in R ear, no tinnitus, no drainage, no aural fullness; Vision changes (diplopia, visual field loss, recent changes, recent eye exam): No Chest pain/palpitations: No History of head injury/concussion: Yes, multiple episodes of trauma to head with falls; Stress/anxiety: No Migraines/headaches: No Nausea/vomiting: No Numbness/tingling: Yes, N/T in L foot x 20 years; Focal weakness: No Dysarthria/dysphagia/drop attacks: No  Has patient fallen in last 6 months? Yes, Number of falls: 12 Pertinent pain: Yes, neck pain, low back pain, L foot pain Dominant hand: left Imaging: No, scheduled for brain MRI 04/29/24; Prior level of function: Independent Occupational demands: Hobbies:  PRECAUTIONS: None  WEIGHT  BEARING RESTRICTIONS No  LIVING ENVIRONMENT: Lives with: lives with their spouse Lives in: House/apartment Stairs: 3 steps to enter (bilateral narrow rails), multi-level home (bedroom on  first);  PATIENT GOALS Decrease dizziness and improve balance   OBJECTIVE EXAMINATION  POSTURE: No gross deficits contributing to symptoms  NEUROLOGICAL SCREEN: Deferred  CRANIAL NERVES Deferred  COORDINATION Deferred   RANGE OF MOTION Cervical Spine AROM limited in extension, rotation, and lateral flexion. Pain reported at end range in all directions;  MANUAL MUSCLE TESTING BUE/BLE strength WNL without focal deficits  TRANSFERS/GAIT Independent for transfers and ambulation without assistive device   PATIENT SURVEYS ABC: 51.3% DHI: 32/100  OCULOMOTOR / VESTIBULAR TESTING  Oculomotor Exam- Room Light  Findings Comments  Ocular Alignment normal   Ocular ROM normal   Spontaneous Nystagmus normal   Gaze-Holding Nystagmus normal   End-Gaze Nystagmus normal   Vergence (normal 2-3) not examined   Smooth Pursuit normal   Cross-Cover Test not examined   Saccades normal   VOR Cancellation not examined   Left Head Impulse not examined   Right Head Impulse not examined   Static Acuity not examined   Dynamic Acuity not examined    Oculomotor Exam- Fixation Suppressed  Findings Comments  Ocular Alignment normal   Spontaneous Nystagmus abnormal 2nd degree pure L horizontal beating nystagmus  Gaze-Holding Nystagmus abnormal Pure L horizontal beating nystagmus with L gaze, absent with R gaze  End-Gaze Nystagmus not examined   Head Shaking Nystagmus not examined   Pressure-Induced Nystagmus normal   Hyperventilation Induced Nystagmus normal   Skull Vibration Induced Nystagmus abnormal Pure R horizontal beating nystagmus with vibration on both R and L mastoid;    BPPV TESTS:  Symptoms Duration Intensity Nystagmus  L Dix-Hallpike None   None  R Dix-Hallpike None   None  L Head Roll Vertigo Approximately 45s Severe Upbeating L torsional  R Head Roll Vertigo Approximately 20s Mild Upbeating R torsional  L Sidelying Test      R Sidelying Test      (blank = not  tested)  Clinical Test of Sensory Interaction for Balance (CTSIB): Deferred  FUNCTIONAL OUTCOME MEASURES  Results Comments  BERG    DGI    FGA    TUG    5TSTS    6 Minute Walk Test    10 Meter Gait Speed    (blank = not tested)   TODAY'S TREATMENT    SUBJECTIVE: Pt reports some vertigo over the weekend but no further episodes since Sunday. She continues with chronic neck pain.   PAIN: Chronic neck pain;   Neuromuscular Re-education   BPPV TESTS:  Symptoms Duration Intensity Nystagmus  L Dix-Hallpike None   None  R Dix-Hallpike None   None  L Sidelying None   None  R Sidelying  None   None  L Head Roll None   None  R Head Roll None   None  Bow None   None  Lean None   None  (blank = not tested)    PATIENT EDUCATION:  Education details: Education about BPPV and plan of care; Person educated: Patient Education method: Explanation Education comprehension: verbalized understanding   HOME EXERCISE PROGRAM:  Access Code: [email or web address] URL: [email or web address] Date: 04/10/2024 Prepared by: Selinda Eck  Patient Education - BPPV   ASSESSMENT: CLINICAL IMPRESSION: Repeated all BPPV testing during session today. No nystagmus and unable to reproduce vertigo in all positions. Vertigo over the weekend but pt also reports stopping her Gabapentin suddenly  which made her feel very bad. Plan to retest and treat for BPPV at future sessions and treat as necessary. If she remains clear next session will likely discharge with instructions to follow-up prn. She will benefit from PT services to address deficits in dizziness and balance in order to improve function at home and decrease her fall risk.  OBJECTIVE IMPAIRMENTS: decreased balance and dizziness.   ACTIVITY LIMITATIONS: stairs and caring for others  PARTICIPATION LIMITATIONS: shopping and community activity  PERSONAL FACTORS: Age, Behavior pattern, Past/current experiences, Time since onset of  injury/illness/exacerbation, and 1-2 comorbidities: chronic pain and DM are also affecting patient's functional outcome.   REHAB POTENTIAL: Good  CLINICAL DECISION MAKING: Stable/uncomplicated  EVALUATION COMPLEXITY: Low   GOALS:  SHORT TERM GOALS: Target date: 05/08/2024  Pt will be independent with HEP for dizziness in order to decrease symptoms, improve balance,decrease fall risk, and improve function at home. Baseline: Goal status: INITIAL   LONG TERM GOALS: Target date: 06/05/2024  Pt will improve ABC by at least 13% in order to demonstrate clinically significant improvement in balance confidence. Baseline: 51.3% Goal status: INITIAL  2.  Pt will decrease DHI score by at least 18 points in order to demonstrate clinically significant reduction in disability related to dizziness.  Baseline: 32/100 Goal status: INITIAL  3.  Pt will report at least 75% improvement in dizziness symptoms in order to improve symptom-free function at home and with leisure activities.      Baseline:  Goal status: INITIAL   PLAN: PT FREQUENCY: 1x/week  PT DURATION: 8 weeks  PLANNED INTERVENTIONS: Therapeutic exercises, Therapeutic activity, Neuromuscular re-education, Balance training, Gait training, Patient/Family education, Self Care, Joint mobilization, Joint manipulation, Vestibular training, Canalith repositioning, Orthotic/Fit training, DME instructions, Dry Needling, Electrical stimulation, Spinal manipulation, Spinal mobilization, Cryotherapy, Moist heat, Taping, Traction, Ultrasound, Ionotophoresis 4mg /ml Dexamethasone, Manual therapy, and Re-evaluation.  PLAN FOR NEXT SESSION: Repeat BPPV testing and treatment as indicated, balance testing;   Selinda BIRCH Karimah Winquist PT, DPT, GCS  Pammie Chirino, PT 05/15/2024, 3:25 PM

## 2024-05-22 ENCOUNTER — Encounter

## 2024-05-24 NOTE — Therapy (Incomplete)
 OUTPATIENT PHYSICAL THERAPY VESTIBULAR TREATMENT  Patient Name: Suzanne Bernard MRN: [id number] DOB:October 14, 1951, 73 y.o., female Today's Date: 05/24/2024  END OF SESSION:   No past medical history on file. 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 sciatica 11/10/2015   Foot pain 10/01/2015   Acute cystitis without hematuria 05/03/2015   Chronic nonalcoholic liver disease 06/29/2014   Pelvic pain in female 12/23/2013   Anemia 06/13/2013   Chronic constipation 08/16/2011   Esophageal reflux 08/16/2011   Gastroesophageal reflux 08/16/2011   Asthma 05/29/2011   Mixed hyperlipidemia 05/29/2011   Type II diabetes mellitus (HCC) 05/29/2011   FROM THE INITIAL EVALUATION PCP: Salli Rand, MD  REFERRING PROVIDER: Milissa Hamming, MD   REFERRING DIAG: R42 (ICD-10-CM) - Dizziness and giddiness   RATIONALE FOR EVALUATION AND TREATMENT: Rehabilitation  THERAPY DIAG: Dizziness and giddiness  ONSET DATE: Approximately 2 years ago  FOLLOW-UP APPT SCHEDULED WITH REFERRING PROVIDER: No    SUBJECTIVE:   Chief Complaint: I've been falling  Pertinent History Pt arrives with complaints of vertigo for multiple years but worsening recently. Symptoms are intermittent and positional. She saw Dr. Milissa and her history was consistent with BPPV but testing was negative in their office. She is scheduled for a brain MRI and ENT wants to add IAC contrast and repeat carotid dopplers. Dopplers in 2022 <40% stenosis but also irregular plaque. She is complaining of ongoing balance issues and recently finished PT at [facility] (April). History of L4-S1 PSF TLIF on 08/27/2023 at [facility] with Dr. Marlyce. Also h/o T11 kyphoplasty. She reports that her back pain is worse now compared to  before her surgery. She has had multiple falls recently and pt states that surgeon has stated that he thinks her hardware shifted after falls. History of chronic L foot pain.   Description of dizziness: Vertigo Frequency: positional Duration: approximately 1 minute Symptom nature: positional Progression of symptoms since onset: worse History of similar episodes: Yes  Provocative Factors: Rolling onto R side, bending forward, looking upward Easing Factors: waiting for symptoms to pass  Auditory complaints (tinnitus, pain, drainage, hearing loss, aural fullness): Yes, constant R ear ache, possible subjective hearing loss in R ear, no tinnitus, no drainage, no aural fullness; Vision changes (diplopia, visual field loss, recent changes, recent eye exam): No Chest pain/palpitations: No History of head injury/concussion: Yes, multiple episodes of trauma to head with falls; Stress/anxiety: No Migraines/headaches: No Nausea/vomiting: No Numbness/tingling: Yes, N/T in L foot x 20 years; Focal weakness: No Dysarthria/dysphagia/drop attacks: No  Has patient fallen in last 6 months? Yes, Number of falls: 12 Pertinent pain: Yes, neck pain, low back pain, L foot pain Dominant hand: left Imaging: No, scheduled for brain MRI 04/29/24; Prior level of function: Independent Occupational demands: Hobbies:  PRECAUTIONS: None  WEIGHT BEARING RESTRICTIONS No  LIVING ENVIRONMENT: Lives with: lives with their spouse Lives in: House/apartment Stairs: 3 steps to enter (bilateral narrow rails), multi-level home (bedroom on first);  PATIENT GOALS Decrease dizziness and improve balance   OBJECTIVE EXAMINATION  POSTURE: No gross deficits contributing to symptoms  NEUROLOGICAL SCREEN: Deferred  CRANIAL NERVES Deferred  COORDINATION Deferred   RANGE OF MOTION Cervical Spine AROM limited in extension, rotation, and lateral flexion. Pain reported at end range in all directions;  MANUAL MUSCLE  TESTING BUE/BLE strength WNL without focal deficits  TRANSFERS/GAIT Independent for transfers and ambulation without assistive device   PATIENT SURVEYS ABC: 51.3% DHI: 32/100  OCULOMOTOR / VESTIBULAR TESTING  Oculomotor Exam- Room Light  Findings Comments  Ocular Alignment normal   Ocular ROM normal   Spontaneous Nystagmus normal   Gaze-Holding Nystagmus normal   End-Gaze Nystagmus normal   Vergence (normal 2-3) not examined   Smooth Pursuit normal   Cross-Cover Test not examined   Saccades normal   VOR Cancellation not examined   Left Head Impulse not examined   Right Head Impulse not examined   Static Acuity not examined   Dynamic Acuity not examined    Oculomotor Exam- Fixation Suppressed  Findings Comments  Ocular Alignment normal   Spontaneous Nystagmus abnormal 2nd degree pure L horizontal beating nystagmus  Gaze-Holding Nystagmus abnormal Pure L horizontal beating nystagmus with L gaze, absent with R gaze  End-Gaze Nystagmus not examined   Head Shaking Nystagmus not examined   Pressure-Induced Nystagmus normal   Hyperventilation Induced Nystagmus normal   Skull Vibration Induced Nystagmus abnormal Pure R horizontal beating nystagmus with vibration on both R and L mastoid;    BPPV TESTS:  Symptoms Duration Intensity Nystagmus  L Dix-Hallpike None   None  R Dix-Hallpike None   None  L Head Roll Vertigo Approximately 45s Severe Upbeating L torsional  R Head Roll Vertigo Approximately 20s Mild Upbeating R torsional  L Sidelying Test      R Sidelying Test      (blank = not tested)  Clinical Test of Sensory Interaction for Balance (CTSIB): Deferred  FUNCTIONAL OUTCOME MEASURES  Results Comments  BERG    DGI    FGA    TUG    5TSTS    6 Minute Walk Test    10 Meter Gait Speed    (blank = not tested)   TODAY'S TREATMENT    SUBJECTIVE: Pt reports some vertigo over the weekend but no further episodes since Sunday. She continues with chronic neck  pain.   PAIN: Chronic neck pain;   Neuromuscular Re-education   BPPV TESTS:  Symptoms Duration Intensity Nystagmus  L Dix-Hallpike None   None  R Dix-Hallpike None   None  L Sidelying None   None  R Sidelying  None   None  L Head Roll None   None  R Head Roll None   None  Bow None   None  Lean None   None  (blank = not tested)    PATIENT EDUCATION:  Education details: Education about BPPV and plan of care; Person educated: Patient Education method: Explanation Education comprehension: verbalized understanding   HOME EXERCISE PROGRAM:  Access Code: [email or web address] URL: [email or web address] Date: 04/10/2024 Prepared by: Selinda Eck  Patient Education - BPPV   ASSESSMENT: CLINICAL IMPRESSION: Repeated all BPPV testing during session today. No nystagmus and unable to reproduce vertigo in all positions. Vertigo over the weekend but pt also reports stopping her Gabapentin suddenly which made her feel very bad. Plan to retest and treat for BPPV at future sessions and treat as necessary. If she remains clear next session will likely discharge with instructions to follow-up prn. She will benefit from PT services to address deficits in dizziness and balance in order to improve function at home and decrease her fall risk.  OBJECTIVE IMPAIRMENTS: decreased balance and dizziness.   ACTIVITY LIMITATIONS: stairs and caring for others  PARTICIPATION LIMITATIONS: shopping and community activity  PERSONAL FACTORS: Age,  Behavior pattern, Past/current experiences, Time since onset of injury/illness/exacerbation, and 1-2 comorbidities: chronic pain and DM are also affecting patient's functional outcome.   REHAB POTENTIAL: Good  CLINICAL DECISION MAKING: Stable/uncomplicated  EVALUATION COMPLEXITY: Low   GOALS:  SHORT TERM GOALS: Target date: 05/08/2024  Pt will be independent with HEP for dizziness in order to decrease symptoms, improve balance,decrease fall risk,  and improve function at home. Baseline: Goal status: INITIAL   LONG TERM GOALS: Target date: 06/05/2024  Pt will improve ABC by at least 13% in order to demonstrate clinically significant improvement in balance confidence. Baseline: 51.3% Goal status: INITIAL  2.  Pt will decrease DHI score by at least 18 points in order to demonstrate clinically significant reduction in disability related to dizziness.  Baseline: 32/100 Goal status: INITIAL  3.  Pt will report at least 75% improvement in dizziness symptoms in order to improve symptom-free function at home and with leisure activities.      Baseline:  Goal status: INITIAL   PLAN: PT FREQUENCY: 1x/week  PT DURATION: 8 weeks  PLANNED INTERVENTIONS: Therapeutic exercises, Therapeutic activity, Neuromuscular re-education, Balance training, Gait training, Patient/Family education, Self Care, Joint mobilization, Joint manipulation, Vestibular training, Canalith repositioning, Orthotic/Fit training, DME instructions, Dry Needling, Electrical stimulation, Spinal manipulation, Spinal mobilization, Cryotherapy, Moist heat, Taping, Traction, Ultrasound, Ionotophoresis 4mg /ml Dexamethasone, Manual therapy, and Re-evaluation.  PLAN FOR NEXT SESSION: Repeat BPPV testing and treatment as indicated, balance testing;   Selinda BIRCH Cailynn Bodnar PT, DPT, GCS  Solace Wendorff, PT 05/24/2024, 5:05 PM

## 2024-05-26 ENCOUNTER — Ambulatory Visit

## 2024-05-26 DIAGNOSIS — R42 Dizziness and giddiness: Secondary | ICD-10-CM
